# Patient Record
Sex: Female | Born: 1970 | Race: White | Hispanic: No | Marital: Married | Smoking: Never smoker
Health system: Southern US, Community
[De-identification: ages and names within clinical notes are randomized; demographics above are authoritative.]

## PROBLEM LIST (undated history)

## (undated) DIAGNOSIS — D649 Anemia, unspecified: Secondary | ICD-10-CM

## (undated) DIAGNOSIS — N949 Unspecified condition associated with female genital organs and menstrual cycle: Secondary | ICD-10-CM

## (undated) DIAGNOSIS — D509 Iron deficiency anemia, unspecified: Secondary | ICD-10-CM

## (undated) DIAGNOSIS — N83209 Unspecified ovarian cyst, unspecified side: Secondary | ICD-10-CM

## (undated) DIAGNOSIS — T7840XA Allergy, unspecified, initial encounter: Secondary | ICD-10-CM

## (undated) DIAGNOSIS — R3129 Other microscopic hematuria: Principal | ICD-10-CM

## (undated) HISTORY — DX: Other microscopic hematuria: R31.29

## (undated) HISTORY — DX: Allergy, unspecified, initial encounter: T78.40XA

## (undated) HISTORY — DX: Unspecified ovarian cyst, unspecified side: N83.209

## (undated) HISTORY — DX: Unspecified condition associated with female genital organs and menstrual cycle: N94.9

## (undated) HISTORY — DX: Anemia, unspecified: D64.9

## (undated) HISTORY — DX: Iron deficiency anemia, unspecified: D50.9

---

## 2004-07-13 ENCOUNTER — Ambulatory Visit (HOSPITAL_BASED_OUTPATIENT_CLINIC_OR_DEPARTMENT_OTHER): Admission: RE | Admit: 2004-07-13 | Discharge: 2004-07-13 | Payer: Self-pay | Admitting: Orthopaedic Surgery

## 2004-07-13 ENCOUNTER — Ambulatory Visit (HOSPITAL_COMMUNITY): Admission: RE | Admit: 2004-07-13 | Discharge: 2004-07-13 | Payer: Self-pay | Admitting: Orthopaedic Surgery

## 2005-09-14 ENCOUNTER — Ambulatory Visit: Payer: Self-pay | Admitting: Internal Medicine

## 2006-12-13 ENCOUNTER — Ambulatory Visit: Payer: Self-pay | Admitting: Internal Medicine

## 2006-12-14 ENCOUNTER — Ambulatory Visit: Payer: Self-pay | Admitting: Urology

## 2008-08-10 ENCOUNTER — Ambulatory Visit: Payer: Self-pay | Admitting: Internal Medicine

## 2008-09-02 ENCOUNTER — Ambulatory Visit: Payer: Self-pay | Admitting: Internal Medicine

## 2008-09-09 ENCOUNTER — Ambulatory Visit: Payer: Self-pay | Admitting: Internal Medicine

## 2008-10-10 ENCOUNTER — Ambulatory Visit: Payer: Self-pay | Admitting: Internal Medicine

## 2008-11-10 ENCOUNTER — Ambulatory Visit: Payer: Self-pay | Admitting: Internal Medicine

## 2009-01-06 ENCOUNTER — Ambulatory Visit: Payer: Self-pay | Admitting: Internal Medicine

## 2009-01-08 ENCOUNTER — Ambulatory Visit: Payer: Self-pay | Admitting: Internal Medicine

## 2009-04-01 ENCOUNTER — Ambulatory Visit: Payer: Self-pay | Admitting: Internal Medicine

## 2009-04-09 ENCOUNTER — Ambulatory Visit: Payer: Self-pay | Admitting: Internal Medicine

## 2009-07-10 ENCOUNTER — Ambulatory Visit: Payer: Self-pay | Admitting: Internal Medicine

## 2009-09-09 ENCOUNTER — Ambulatory Visit: Payer: Self-pay | Admitting: Internal Medicine

## 2009-09-16 ENCOUNTER — Ambulatory Visit: Payer: Self-pay | Admitting: Internal Medicine

## 2009-10-10 ENCOUNTER — Ambulatory Visit: Payer: Self-pay | Admitting: Internal Medicine

## 2009-10-10 HISTORY — PX: ABLATION: SHX5711

## 2009-11-10 ENCOUNTER — Ambulatory Visit: Payer: Self-pay | Admitting: Internal Medicine

## 2009-12-08 ENCOUNTER — Ambulatory Visit: Payer: Self-pay | Admitting: Internal Medicine

## 2010-01-08 ENCOUNTER — Ambulatory Visit: Payer: Self-pay | Admitting: Internal Medicine

## 2010-04-14 ENCOUNTER — Ambulatory Visit: Payer: Self-pay

## 2010-04-30 ENCOUNTER — Ambulatory Visit: Payer: Self-pay | Admitting: Internal Medicine

## 2010-05-10 ENCOUNTER — Ambulatory Visit: Payer: Self-pay | Admitting: Internal Medicine

## 2010-06-10 ENCOUNTER — Ambulatory Visit: Payer: Self-pay | Admitting: Internal Medicine

## 2010-07-06 ENCOUNTER — Ambulatory Visit: Payer: Self-pay | Admitting: Obstetrics and Gynecology

## 2010-07-09 ENCOUNTER — Ambulatory Visit: Payer: Self-pay | Admitting: Obstetrics and Gynecology

## 2010-09-01 ENCOUNTER — Ambulatory Visit: Payer: Self-pay | Admitting: Internal Medicine

## 2010-09-09 ENCOUNTER — Ambulatory Visit: Payer: Self-pay | Admitting: Internal Medicine

## 2010-10-10 ENCOUNTER — Ambulatory Visit: Payer: Self-pay | Admitting: Internal Medicine

## 2010-10-10 ENCOUNTER — Ambulatory Visit: Payer: Self-pay

## 2010-12-29 ENCOUNTER — Ambulatory Visit: Payer: Self-pay | Admitting: Internal Medicine

## 2011-01-09 ENCOUNTER — Ambulatory Visit: Payer: Self-pay | Admitting: Internal Medicine

## 2011-02-23 ENCOUNTER — Ambulatory Visit: Payer: Self-pay | Admitting: Internal Medicine

## 2011-03-11 ENCOUNTER — Ambulatory Visit: Payer: Self-pay | Admitting: Internal Medicine

## 2011-05-04 ENCOUNTER — Ambulatory Visit: Payer: Self-pay | Admitting: Internal Medicine

## 2011-05-11 ENCOUNTER — Ambulatory Visit: Payer: Self-pay

## 2011-05-11 ENCOUNTER — Ambulatory Visit: Payer: Self-pay | Admitting: Internal Medicine

## 2011-05-20 ENCOUNTER — Ambulatory Visit: Payer: Self-pay | Admitting: Internal Medicine

## 2011-06-15 ENCOUNTER — Ambulatory Visit: Payer: Self-pay | Admitting: Internal Medicine

## 2011-07-11 ENCOUNTER — Ambulatory Visit: Payer: Self-pay | Admitting: Internal Medicine

## 2011-08-11 ENCOUNTER — Ambulatory Visit: Payer: Self-pay | Admitting: Internal Medicine

## 2011-11-30 ENCOUNTER — Ambulatory Visit: Payer: Self-pay | Admitting: Internal Medicine

## 2011-11-30 LAB — IRON AND TIBC
Iron Saturation: 36 %
Iron: 91 ug/dL (ref 50–170)
Unbound Iron-Bind.Cap.: 160 ug/dL

## 2011-11-30 LAB — FERRITIN: Ferritin (ARMC): 273 ng/mL (ref 8–388)

## 2011-12-09 ENCOUNTER — Ambulatory Visit: Payer: Self-pay | Admitting: Internal Medicine

## 2012-04-25 ENCOUNTER — Ambulatory Visit: Payer: Self-pay

## 2012-10-01 ENCOUNTER — Ambulatory Visit: Payer: Self-pay | Admitting: Internal Medicine

## 2012-10-01 LAB — CBC CANCER CENTER
Basophil #: 0.1 x10 3/mm (ref 0.0–0.1)
Basophil %: 0.9 %
Eosinophil #: 0.2 x10 3/mm (ref 0.0–0.7)
Eosinophil %: 1.9 %
HGB: 14.4 g/dL (ref 12.0–16.0)
MCH: 30.2 pg (ref 26.0–34.0)
MCHC: 33.8 g/dL (ref 32.0–36.0)
MCV: 89 fL (ref 80–100)
Monocyte #: 0.5 x10 3/mm (ref 0.2–0.9)
Neutrophil %: 66.1 %
Platelet: 218 x10 3/mm (ref 150–440)
RBC: 4.77 10*6/uL (ref 3.80–5.20)
RDW: 12.9 % (ref 11.5–14.5)

## 2012-10-01 LAB — IRON AND TIBC
Iron Bind.Cap.(Total): 269 ug/dL (ref 250–450)
Unbound Iron-Bind.Cap.: 220 ug/dL

## 2012-10-01 LAB — FERRITIN: Ferritin (ARMC): 227 ng/mL (ref 8–388)

## 2012-10-10 ENCOUNTER — Ambulatory Visit: Payer: Self-pay | Admitting: Internal Medicine

## 2013-01-08 ENCOUNTER — Ambulatory Visit: Payer: Self-pay | Admitting: Internal Medicine

## 2013-05-08 ENCOUNTER — Ambulatory Visit: Payer: Self-pay

## 2013-05-10 ENCOUNTER — Ambulatory Visit: Payer: Self-pay | Admitting: Internal Medicine

## 2013-09-10 ENCOUNTER — Ambulatory Visit: Payer: Self-pay | Admitting: Internal Medicine

## 2013-11-28 ENCOUNTER — Ambulatory Visit: Payer: Self-pay | Admitting: Internal Medicine

## 2013-11-28 LAB — FERRITIN: Ferritin (ARMC): 309 ng/mL (ref 8–388)

## 2013-11-28 LAB — CBC CANCER CENTER
Basophil #: 0.1 x10 3/mm (ref 0.0–0.1)
Basophil %: 0.9 %
Eosinophil #: 0.2 x10 3/mm (ref 0.0–0.7)
Eosinophil %: 2.7 %
HCT: 43.6 % (ref 35.0–47.0)
HGB: 14.3 g/dL (ref 12.0–16.0)
LYMPHS ABS: 2.3 x10 3/mm (ref 1.0–3.6)
LYMPHS PCT: 24.9 %
MCH: 29.9 pg (ref 26.0–34.0)
MCHC: 32.8 g/dL (ref 32.0–36.0)
MCV: 91 fL (ref 80–100)
MONO ABS: 0.8 x10 3/mm (ref 0.2–0.9)
Monocyte %: 8.5 %
NEUTROS ABS: 5.9 x10 3/mm (ref 1.4–6.5)
Neutrophil %: 63 %
Platelet: 243 x10 3/mm (ref 150–440)
RBC: 4.77 10*6/uL (ref 3.80–5.20)
RDW: 12.8 % (ref 11.5–14.5)
WBC: 9.4 x10 3/mm (ref 3.6–11.0)

## 2013-11-28 LAB — IRON AND TIBC
IRON SATURATION: 17 %
Iron Bind.Cap.(Total): 271 ug/dL (ref 250–450)
Iron: 46 ug/dL — ABNORMAL LOW (ref 50–170)
Unbound Iron-Bind.Cap.: 225 ug/dL

## 2013-12-08 ENCOUNTER — Ambulatory Visit: Payer: Self-pay | Admitting: Internal Medicine

## 2013-12-26 DIAGNOSIS — N920 Excessive and frequent menstruation with regular cycle: Secondary | ICD-10-CM | POA: Insufficient documentation

## 2013-12-26 DIAGNOSIS — N83209 Unspecified ovarian cyst, unspecified side: Secondary | ICD-10-CM

## 2013-12-26 DIAGNOSIS — M799 Soft tissue disorder, unspecified: Secondary | ICD-10-CM | POA: Insufficient documentation

## 2013-12-26 DIAGNOSIS — N949 Unspecified condition associated with female genital organs and menstrual cycle: Secondary | ICD-10-CM

## 2013-12-26 DIAGNOSIS — D509 Iron deficiency anemia, unspecified: Secondary | ICD-10-CM | POA: Insufficient documentation

## 2013-12-26 DIAGNOSIS — D649 Anemia, unspecified: Secondary | ICD-10-CM

## 2013-12-26 DIAGNOSIS — M7989 Other specified soft tissue disorders: Secondary | ICD-10-CM | POA: Insufficient documentation

## 2013-12-26 HISTORY — DX: Iron deficiency anemia, unspecified: D50.9

## 2013-12-26 HISTORY — DX: Anemia, unspecified: D64.9

## 2013-12-26 HISTORY — DX: Unspecified condition associated with female genital organs and menstrual cycle: N94.9

## 2013-12-26 HISTORY — DX: Unspecified ovarian cyst, unspecified side: N83.209

## 2014-06-09 ENCOUNTER — Ambulatory Visit: Payer: Self-pay

## 2014-08-15 ENCOUNTER — Ambulatory Visit: Payer: Self-pay | Admitting: Internal Medicine

## 2014-08-15 LAB — CBC CANCER CENTER
Basophil #: 0.1 x10 3/mm (ref 0.0–0.1)
Basophil %: 0.8 %
Eosinophil #: 0.2 x10 3/mm (ref 0.0–0.7)
Eosinophil %: 1.4 %
HCT: 42.7 % (ref 35.0–47.0)
HGB: 14.4 g/dL (ref 12.0–16.0)
Lymphocyte #: 2.3 x10 3/mm (ref 1.0–3.6)
Lymphocyte %: 21.1 %
MCH: 31 pg (ref 26.0–34.0)
MCHC: 33.7 g/dL (ref 32.0–36.0)
MCV: 92 fL (ref 80–100)
Monocyte #: 0.8 x10 3/mm (ref 0.2–0.9)
Monocyte %: 6.8 %
NEUTROS ABS: 7.8 x10 3/mm — AB (ref 1.4–6.5)
NEUTROS PCT: 69.9 %
PLATELETS: 215 x10 3/mm (ref 150–440)
RBC: 4.64 10*6/uL (ref 3.80–5.20)
RDW: 12.6 % (ref 11.5–14.5)
WBC: 11.1 x10 3/mm — AB (ref 3.6–11.0)

## 2014-08-15 LAB — FERRITIN: FERRITIN (ARMC): 421 ng/mL — AB (ref 8–388)

## 2014-08-15 LAB — IRON AND TIBC
IRON BIND. CAP.(TOTAL): 246 ug/dL — AB (ref 250–450)
IRON SATURATION: 21 %
Iron: 52 ug/dL (ref 50–170)
UNBOUND IRON-BIND. CAP.: 194 ug/dL

## 2014-09-09 ENCOUNTER — Ambulatory Visit: Payer: Self-pay | Admitting: Internal Medicine

## 2015-05-04 ENCOUNTER — Other Ambulatory Visit: Payer: Self-pay | Admitting: Obstetrics and Gynecology

## 2015-05-04 DIAGNOSIS — Z1231 Encounter for screening mammogram for malignant neoplasm of breast: Secondary | ICD-10-CM

## 2015-05-07 ENCOUNTER — Other Ambulatory Visit: Payer: Self-pay | Admitting: Internal Medicine

## 2015-05-07 DIAGNOSIS — R1031 Right lower quadrant pain: Secondary | ICD-10-CM

## 2015-05-07 DIAGNOSIS — R319 Hematuria, unspecified: Secondary | ICD-10-CM

## 2015-05-07 DIAGNOSIS — R1032 Left lower quadrant pain: Secondary | ICD-10-CM

## 2015-05-08 ENCOUNTER — Ambulatory Visit
Admission: RE | Admit: 2015-05-08 | Discharge: 2015-05-08 | Disposition: A | Discharge status: Home / Self Care | Payer: BC Managed Care – PPO | Source: Ambulatory Visit | Attending: Internal Medicine | Admitting: Internal Medicine

## 2015-05-08 DIAGNOSIS — R319 Hematuria, unspecified: Secondary | ICD-10-CM | POA: Diagnosis not present

## 2015-05-08 DIAGNOSIS — R1032 Left lower quadrant pain: Secondary | ICD-10-CM | POA: Insufficient documentation

## 2015-05-08 DIAGNOSIS — R1031 Right lower quadrant pain: Secondary | ICD-10-CM | POA: Diagnosis present

## 2015-05-15 ENCOUNTER — Encounter: Payer: Self-pay | Admitting: Physical Therapy

## 2015-05-15 ENCOUNTER — Ambulatory Visit: Payer: BC Managed Care – PPO | Attending: Obstetrics and Gynecology | Admitting: Physical Therapy

## 2015-05-15 DIAGNOSIS — Z7409 Other reduced mobility: Secondary | ICD-10-CM | POA: Diagnosis present

## 2015-05-15 DIAGNOSIS — R279 Unspecified lack of coordination: Secondary | ICD-10-CM | POA: Insufficient documentation

## 2015-05-15 DIAGNOSIS — M25559 Pain in unspecified hip: Secondary | ICD-10-CM | POA: Diagnosis present

## 2015-05-16 NOTE — Therapy (Addendum)
[facility] MAIN [facility] SERVICES [address] [place], [place], [address] Phone: [phone]   Fax:  [phone]  Physical Therapy Evaluation  Patient Details  Name: Lauren Adkins MRN: [id number] Date of Birth: Jun 11, 1971 Referring Provider:  Christeen Douglas, MD  Encounter Date: 05/15/2015    Past Medical History  Diagnosis Date  . Allergy     Past Surgical History  Procedure Laterality Date  . Ablation  2011    abdominal for heavy periods     There were no vitals filed for this visit.  Visit Diagnosis:  Impaired functional mobility and activity tolerance  Lack of coordination  Pain in joint, pelvic region and thigh, unspecified laterality   Subjective: Pt has experienced pelvic pain for at least 44 yo with Sx that has been become increasingly worst w/ tolerating pelvic exams, years prior tampon wearing, dyspareunia, pain with sitting. Pain is constant (mild in the morning 2/10 , end of the day 8/10) since May . Easing factors: not sitting. Aggravating: sitting > 1.5 hr. 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  Pain w/ sexual intercourse more  intense  immediately afterwards  for 5 days, and pain also occurs with on top position. Pt reports frequent urination every 1.5 hr and SUI with sneezing, coughing, laughing.  Denied difficulty with initiating uriantion. 5x/week bowel movement but 505 of the time Type 4 and other 50% Type 6 (for the past 6 month). Pain has made it difficult to fall asleep due to position. (Sidelying is more comfortable) .  Pt went to Dr. Dareen Piano last week who performed some tests and she was told that there was blood in your urine, swollen lymph nodes, and MD suspects a viral abdominal infection.Pt had completed her antibiotics that was prescribed.           [facility] PT Assessment - 05/19/15 0753    Assessment   Medical Diagnosis pelvic pain    Precautions   Precautions None    Restrictions   Weight Bearing Restrictions No   Observation/Other Assessments   Other Surveys  --  FSS 78% (lower %=better function),FSFI 10% (low satisfaction   Coordination   Gross Motor Movements are Fluid and Coordinated --  mulitifidis delayed   Posture/Postural Control   Posture Comments chest breathing dominant  lumbopelvic instability w/ ALSR   AROM   Overall AROM Comments 25% bilateral rotation of trunk    Palpation   Spinal mobility increased paraspinal mm tensions,   SI assessment  decreased pain with medially applied force closure on iliac  L PSIS more anterior, tenderness w/ inferior glide   Palpation comment coccyx deviated to L , increased mm tensions on R coccygeus/ obt int                   [facility] Adult PT Treatment/Exercise - 05/19/15 0753    Self-Care   Self-Care --  standing in the back of room at meetings, movement of hips.   Neuro Re-ed    Neuro Re-ed Details  proper sitting posture, log rolling technique, diaphragmatic breathing   Exercises   Exercises --  figure -4 stretch seated, on R   Manual Therapy   Joint Mobilization sup glide of sacrum    Soft tissue mobilization at R coccygeus/ obt int R, decreased anterior pelvic pain post-Tx   decreased mm tensions, coccyx more medially aligned  PT Education - 05/19/15 0803    Education Details HEP, physiology and anatomy with images, POC, outcome measures, musculoskeletal causes for pelvic pain    Person(s) Educated Patient   Methods Explanation;Demonstration;Tactile cues;Verbal cues;Handout   Comprehension Verbalized understanding             PT Long Term Goals - 05/19/15 0804    PT LONG TERM GOAL #1   Title Pt will score an decrease from 78% to < 50%  in order to demo improved energy resesrve after work to increase participation w/ family at home.    Time 12   Period Weeks   Status New   PT LONG TERM GOAL #2   Title Pt will be able to score an increase from  10% to > 30% on FSFI in order to improve QOL.    Time 12   Period Weeks   Status New   PT LONG TERM GOAL #3   Title Pt will demo medially aligned coccyx and demo proper sitting posture w/o cuing across 2 visits in order to decrease pelvic girdle pain and increase ability to sit at her desk.   Time 12   Period Weeks   Status New               Plan - 05/19/15 0808    Clinical Impression Statement Pt is 44 yo female who c/o chronic pelvic pain.  Pt's S & Sx consist of L deviated coccyx, increased pelvic floor mm tensions, poor deep core coordination, poor sitting posture, and limited spinal mobility. These deficits impact her ability tolerate sitting through meetings at work and is associated with her inability  to tolerate pelvic exam, and sexual intercourse  without pain and to have energy after work to participate in fitnesss and family activities. Pt's pelvic pain decreased from 7/10 to 4/10 post-manual Tx and pt demo'd proper sitting posture after neuro-re-edcation.     Pt will benefit from skilled therapeutic intervention in order to improve on the following deficits Decreased activity tolerance;Decreased endurance;Decreased coordination;Decreased balance;Decreased range of motion;Decreased safety awareness;Impaired flexibility;Postural dysfunction;Improper body mechanics;Decreased strength;Increased muscle spasms;Hypomobility;Pain   Rehab Potential Good   Clinical Impairments Affecting Rehab Potential chronicity, sedentary job,    PT Frequency 1x / week   PT Duration 12 weeks   PT Treatment/Interventions ADLs/Self Care Home Management;Aquatic Therapy;Biofeedback;Cryotherapy;Electrical Stimulation;Moist Heat;Traction;Patient/family education;Neuromuscular re-education;Therapeutic exercise;Balance training;Therapeutic activities;Functional mobility training;Gait training;Manual techniques;Scar mobilization;Passive range of motion;Energy conservation;Dry needling   PT Next Visit Plan ergo  assessment, car seat modification   Consulted and Agree with Plan of Care Patient         Problem List There are no active problems to display for this patient.   Mariane Masters  ,PT, DPT, E-RYT  05/19/2015, 8:18 AM  [place] [facility] MAIN [facility] SERVICES [address] [place], [place], [address] Phone: [phone]   Fax:  [phone]

## 2015-05-19 ENCOUNTER — Ambulatory Visit: Payer: BC Managed Care – PPO | Admitting: Physical Therapy

## 2015-05-19 DIAGNOSIS — M25559 Pain in unspecified hip: Secondary | ICD-10-CM

## 2015-05-19 DIAGNOSIS — Z7409 Other reduced mobility: Secondary | ICD-10-CM | POA: Diagnosis not present

## 2015-05-19 DIAGNOSIS — R279 Unspecified lack of coordination: Secondary | ICD-10-CM

## 2015-05-19 NOTE — Patient Instructions (Addendum)
Proper sitting posture Diaphragmatic breathing Log rolling technique out of bed  Figure 4 stretch on RLE seated

## 2015-05-19 NOTE — Addendum Note (Signed)
Addended by: Mariane Masters on: 05/19/2015 08:23 AM   Modules accepted: Orders

## 2015-05-20 NOTE — Patient Instructions (Addendum)
Child's pose rocking, and pelvic tilt with breathing awareness when laying on back Child's pose stretch and modified in standing at work  Biomedical scientist modification w/ towels Equal contact of back of pelvis to car seat when driving

## 2015-05-20 NOTE — Therapy (Signed)
[place] [facility] MAIN [facility] SERVICES [address] [place], [place], [address] Phone: [phone]   Fax:  [phone]  Physical Therapy Treatment  Patient Details  Name: Lauren Adkins MRN: [id number] Date of Birth: 1970-12-14 Referring Provider:  Christeen Douglas, MD  Encounter Date: 05/19/2015      PT End of Session - 05/20/15 0908    Visit Number 2   Number of Visits 12   Date for PT Re-Evaluation 07/22/15   PT Start Time 0905   PT Stop Time 1010   PT Time Calculation (min) 65 min   Activity Tolerance Patient tolerated treatment well;No increased pain  reported decreased pain from 7/10 to 3/10 post Tx   Behavior During Therapy [facility] for tasks assessed/performed      Past Medical History  Diagnosis Date  . Allergy     Past Surgical History  Procedure Laterality Date  . Ablation  2011    abdominal for heavy periods     There were no vitals filed for this visit.  Visit Diagnosis:  Impaired functional mobility and activity tolerance  Lack of coordination  Pain in joint, pelvic region and thigh, unspecified laterality      Subjective Assessment - 05/19/15 0910    Subjective Pt reported she was feeling "pretty good" after last session. Overall, she "feels her pain is better"  despite a strong epiosde on Sat which she did not noticed how long it lasted. Today pt feels the best she has felt in weeks!  Pt still felt fatigued by the end of her work day yesterday but she tried to stand during meetings, stretched, and walked.  Pt has completed her antibiotics.    Pertinent History "Broke tailbone" during childbirth 44 yo  (pain getting out of bed, sitting down) and resolved in 44 yo. No tears/episiotomy.     How long can you sit comfortably? 1.5 hrs   Patient Stated Goals 1) get back to "normalcy" 2) increased energy (from 7/10 fatigue level to 3-4/10)  to cook , clean, physical activity              [facility] PT Assessment -  05/20/15 0859    Assessment   Medical Diagnosis pelvic pain    Precautions   Precautions None   Restrictions   Weight Bearing Restrictions No   Observation/Other Assessments   Other Surveys  --  FSS 78% (lower %=better function),FSFI 10% (low satisfaction   Coordination   Gross Motor Movements are Fluid and Coordinated --  mulitifidis delayed   Posture/Postural Control   Posture Comments improved diaphragmatic breathing post0-Tx   Palpation   Spinal mobility decreased paraspinal mm tensions post-Tx,   Palpation comment coccyx medially alignmed  decreased R coccygeus/sacrospinous ligament tensions post-Tx                     [facility] Adult PT Treatment/Exercise - 05/20/15 0859    Bed Mobility   Bed Mobility --  required review of log rolling   Self-Care   Self-Care --  demo'd carseat modifications w./ towels   Neuro Re-ed    Neuro Re-ed Details  log rolling, childs pose stretch on bed and standing, childs pose rocking, pelvic tilt in hooklying, applications of pelvic propioception in sexual positions, ensure equal contact of PSIS against car seat when driving  cat/cow was difficult for pt, withheld   Moist Heat Therapy   Number Minutes Moist Heat 8 Minutes   Moist Heat Location  Lumbar Spine  skin intact post-Tx   Manual Therapy   Joint Mobilization Grade III PA mobs   T5, T7, 10   spinous processes   Soft tissue mobilization sustained pressure, myofascial release R occcygeus/sacrospinous ligament area  paraspinals effleurage                PT Education - 05/20/15 0908    Education provided Yes   Education Details HEP   Person(s) Educated Patient   Methods Explanation;Demonstration;Tactile cues;Verbal cues;Handout   Comprehension Verbalized understanding;Returned demonstration             PT Long Term Goals - 05/19/15 0804    PT LONG TERM GOAL #1   Title Pt will score an decrease from 78% to < 50%  in order to demo improved energy resesrve  after work to increase participation w/ family at home.    Time 12   Period Weeks   Status New   PT LONG TERM GOAL #2   Title Pt will be able to score an increase from 10% to > 30% on FSFI in order to improve QOL.    Time 12   Period Weeks   Status New   PT LONG TERM GOAL #3   Title Pt will demo medially aligned coccyx and demo proper sitting posture w/o cuing across 2 visits in order to decrease pelvic girdle pain and increase ability to sit at her desk.   Time 12   Period Weeks   Status New               Plan - 05/20/15 0909    Clinical Impression Statement Pt is progressing well with lasting effects of manual Tx as pt reported feeling the best she has felt in a weeks today despite one episode of pain over the weekend. Post-Tx today,  pt showed more medially aligned coccyx today and decreased tensions at  R coccygeus/sacrospinous ligament area. Pt showed difficulty with cat/cow exercise and was downgraded to childs pose rocking. Pt will continue to benefit from manual Tx/ ther ex/ neuro-reedu to maintain spinal mobility and learn lumbopelvic propioception and motor control. Anticipate pt will continue to progress well. .     Pt will benefit from skilled therapeutic intervention in order to improve on the following deficits Decreased activity tolerance;Decreased endurance;Decreased coordination;Decreased balance;Decreased range of motion;Decreased safety awareness;Impaired flexibility;Postural dysfunction;Improper body mechanics;Decreased strength;Increased muscle spasms;Hypomobility;Pain   Rehab Potential Good   Clinical Impairments Affecting Rehab Potential chronicity, sedentary job,    PT Frequency 1x / week   PT Duration 12 weeks   PT Treatment/Interventions ADLs/Self Care Home Management;Aquatic Therapy;Biofeedback;Cryotherapy;Electrical Stimulation;Moist Heat;Traction;Patient/family education;Neuromuscular re-education;Therapeutic exercise;Balance training;Therapeutic  activities;Functional mobility training;Gait training;Manual techniques;Scar mobilization;Passive range of motion;Energy conservation;Dry needling   PT Next Visit Plan ergo assessment, intravaginal assessment   Consulted and Agree with Plan of Care Patient        Problem List There are no active problems to display for this patient.   Mariane Masters  ,PT, DPT, E-RYT  05/20/2015, 9:17 AM  [place] [facility] MAIN [facility] SERVICES [address] [place], [place], [address] Phone: [phone]   Fax:  [phone]

## 2015-06-01 ENCOUNTER — Telehealth: Payer: Self-pay | Admitting: Urology

## 2015-06-01 ENCOUNTER — Encounter: Payer: Self-pay | Admitting: *Deleted

## 2015-06-01 ENCOUNTER — Ambulatory Visit (INDEPENDENT_AMBULATORY_CARE_PROVIDER_SITE_OTHER): Payer: BC Managed Care – PPO | Admitting: Urology

## 2015-06-01 VITALS — BP 128/85 | HR 81 | Resp 18 | Ht 70.0 in | Wt 194.4 lb

## 2015-06-01 DIAGNOSIS — R3129 Other microscopic hematuria: Secondary | ICD-10-CM

## 2015-06-01 DIAGNOSIS — R312 Other microscopic hematuria: Secondary | ICD-10-CM

## 2015-06-01 DIAGNOSIS — R102 Pelvic and perineal pain unspecified side: Secondary | ICD-10-CM

## 2015-06-01 HISTORY — DX: Other microscopic hematuria: R31.29

## 2015-06-01 LAB — MICROSCOPIC EXAMINATION: Bacteria, UA: NONE SEEN

## 2015-06-01 LAB — URINALYSIS, COMPLETE
BILIRUBIN UA: NEGATIVE
GLUCOSE, UA: NEGATIVE
KETONES UA: NEGATIVE
Nitrite, UA: NEGATIVE
PROTEIN UA: NEGATIVE
Specific Gravity, UA: 1.025 (ref 1.005–1.030)
Urobilinogen, Ur: 0.2 mg/dL (ref 0.2–1.0)
pH, UA: 5.5 (ref 5.0–7.5)

## 2015-06-01 LAB — BLADDER SCAN AMB NON-IMAGING

## 2015-06-01 NOTE — Telephone Encounter (Signed)
Pt seen by Dr. Mena Goes 8/22 and was to schedule a cysto in 2-3 weeks.  Patient elected to go back to office, check her schedule and will call back to make appt.  Follow up

## 2015-06-01 NOTE — Progress Notes (Signed)
Patient seen today for several issues refer by PCP Dr. Dareen Piano. GYN Dr. Dalbert Garnet.  1-microscopic hematuria-urinalyses over July 20 15th of July 2016 had shown anywhere from 4-10 red blood cells per high-powered field up to 10-50 red blood cells per high-powered field. Patient underwent a recent CT scan without IV contrast which showed a benign urinary tract. It did show enteritis with some mesenteric stranding and some nonspecific lymphadenopathy in the abdomen. She also had a 2.8 cm left adnexal cyst. Her BUN was 6 and creatinine 0.7.  No gross hematuria. No exposure risk.  She has a h/o of what sounds like nephrocalcinosis and was seen by Dr. Achilles Dunk, but no stones on recent CT.   2- Patient has some baseline urinary frequency and urgency - she was treated about 16 yrs ago with medication.   2-pelvic pain-patient was seen recently by her GYN Dr. Dalbert Garnet and complained of pain in her pelvis and dyspareunia. Patient had a normal pelvic exam with a nontender bladder and adnexa but was tender along the pelvic floor including the piriformis and the urethra. She was appropriately referred to physical therapy which is pending.  Review of systems was obtained, 13 system, positive for nausea, diarrhea, fatigue, swollen glands, back pain, headache and dizziness. Otherwise negative.  Imaging and lab work as above.   Assessment/plan:  #1 microscopic hematuria-patient does need complete evaluation with repeat pelvic exam and cystoscopy. This will be arranged. Her UA is clear today.  #2 urinary frequency, urgency-discussed the nature risks and benefits of trial of an anti-muscarinic. All questions answered. She elects to continue surveillance. See below. Doing better with PT.   #3 pelvic pain-I did recommend she keep her appointments. She has been to two PT sessions and doing well. She notes an improvement in her symptoms. We discussed physical therapy can improve pelvic pain and also lower urinary tract  symptoms.

## 2015-06-02 ENCOUNTER — Ambulatory Visit: Payer: BC Managed Care – PPO | Admitting: Physical Therapy

## 2015-06-02 DIAGNOSIS — R279 Unspecified lack of coordination: Secondary | ICD-10-CM

## 2015-06-02 DIAGNOSIS — Z7409 Other reduced mobility: Secondary | ICD-10-CM | POA: Diagnosis not present

## 2015-06-02 DIAGNOSIS — M25559 Pain in unspecified hip: Secondary | ICD-10-CM

## 2015-06-03 NOTE — Therapy (Signed)
[place] [facility] MAIN [facility] SERVICES [address] [place], [place], [address] Phone: [phone]   Fax:  [phone]  Physical Therapy Treatment  Patient Details  Name: Lauren Adkins MRN: [id number] Date of Birth: [birth year] Referring Provider:  Christeen Douglas, MD  Encounter Date: 06/02/2015      PT End of Session - 06/03/15 0823    Visit Number 3   Number of Visits 12   Date for PT Re-Evaluation 07/22/15   PT Start Time 0805   PT Stop Time 0905   PT Time Calculation (min) 60 min   Activity Tolerance Patient tolerated treatment well;No increased pain  reported decreased pain from 7/10 to 3/10 post Tx   Behavior During Therapy [facility] for tasks assessed/performed      Past Medical History  Diagnosis Date  . Allergy     Past Surgical History  Procedure Laterality Date  . Ablation  2011    abdominal for heavy periods     There were no vitals filed for this visit.  Visit Diagnosis:  Impaired functional mobility and activity tolerance  Lack of coordination  Pain in joint, pelvic region and thigh, unspecified laterality      Subjective Assessment - 06/02/15 0807    Subjective Pt states her  pelvic pain has been 2/10 for past weeks but she struggles to relax.  Pt is been trying to get back to her HEP  this week. Through motivational interveiwing, pt stated she has a strategy to stay compliant by placing her exercises sheet on the diner room table.    Pertinent History "Broke tailbone" during childbirth 44 yo  (pain getting out of bed, sitting down) and resolved in 44 yo. No tears/episiotomy.     How long can you sit comfortably? 1.5 hrs   Patient Stated Goals 1) get back to "normalcy" 2) increased energy (from 7/10 fatigue level to 3-4/10)  to cook , clean, physical activity     Currently in Pain? Yes   Pain Score 2             [facility] PT Assessment - 06/03/15 0818    Observation/Other Assessments   Other Surveys  Other  Surveys  FSS 78% ,FSFI 10%, Zung Anxiety 50%    Palpation   Spinal mobility decreased paraspinal mm tensions post-Tx,   Palpation comment coccyx slight deviation to L   medially aligned post-Tx                  Pelvic Floor Special Questions - 06/03/15 0819    Pelvic Floor Internal Exam pt verbally consented without contraindications   Exam Type Vaginal   Palpation increased tenderness along iliococcygeus/ Obt Int R   decreased tenderness.tensions post-Tx           [facility] Adult PT Treatment/Exercise - 06/03/15 0820    Manual Therapy   Soft tissue mobilization at R coccygeus/ obt int R, decreased anterior pelvic pain post-Tx   decreased mm tensions, coccyx more medially aligned   Internal Pelvic Floor sustained pressure and thiele massage along R obt int/iliococcygeus                PT Education - 06/03/15 6962    Education provided Yes   Education Details HEP   Person(s) Educated Patient   Methods Explanation;Demonstration;Tactile cues;Verbal cues   Comprehension Verbalized understanding;Returned demonstration             PT Long Term Goals - 06/03/15 9528  PT LONG TERM GOAL #1   Title Pt will score an decrease from 78% to < 50%  in order to demo improved energy resesrve after work to increase participation w/ family at home.    Time 12   Period Weeks   Status On-going   PT LONG TERM GOAL #2   Title Pt will be able to score an increase from 10% to > 30% on FSFI in order to improve QOL.    Time 12   Period Weeks   Status On-going   PT LONG TERM GOAL #3   Title Pt will demo medially aligned coccyx and demo proper sitting posture w/o cuing across 2 visits in order to decrease pelvic girdle pain and increase ability to sit at her desk.   Time 12   Period Weeks   Status Achieved               Plan - 06/03/15 1610    Clinical Impression Statement Pt cotninues to significantly low pelvic pain levels (2/10) the past two weeks. Pt  tolerated internal manual Tx with decreased tenderness / tensions along R obturator internus and iliococcygeus as well as decreased mm tensions R coccygeus/sacrospinous ligament area through external manual Tx.  Pt showed medially aligned coccyx and improved upright posture. Initiated body scan practice to address anxiety and pt's need to learn to relax. Pt will continue to benefit from skilled PT to advance towards her goals.      Pt will benefit from skilled therapeutic intervention in order to improve on the following deficits Decreased activity tolerance;Decreased endurance;Decreased coordination;Decreased balance;Decreased range of motion;Decreased safety awareness;Impaired flexibility;Postural dysfunction;Improper body mechanics;Decreased strength;Increased muscle spasms;Hypomobility;Pain   Rehab Potential Good   Clinical Impairments Affecting Rehab Potential chronicity, sedentary job,    PT Frequency 1x / week   PT Duration 12 weeks   PT Treatment/Interventions ADLs/Self Care Home Management;Aquatic Therapy;Biofeedback;Cryotherapy;Electrical Stimulation;Moist Heat;Traction;Patient/family education;Neuromuscular re-education;Therapeutic exercise;Balance training;Therapeutic activities;Functional mobility training;Gait training;Manual techniques;Scar mobilization;Passive range of motion;Energy conservation;Dry needling   PT Next Visit Plan ergo assessment, deep core ex, restorative poses   Consulted and Agree with Plan of Care Patient        Problem List Patient Active Problem List   Diagnosis Date Noted  . Microhematuria 06/01/2015  . Pelvic pain in female 06/01/2015  . Absolute anemia 12/26/2013  . Soft tissue lesion of shoulder region 12/26/2013  . Excessive and frequent menstruation 12/26/2013  . Anemia, iron deficiency 12/26/2013  . Cyst of ovary 12/26/2013  . Female genital symptoms 12/26/2013    Mariane Masters ,PT, DPT, E-RYT  06/03/2015, 8:29 AM  [place] [facility] MAIN [facility] SERVICES [address] [place], [place], [address] Phone: [phone]   Fax:  [phone]

## 2015-06-03 NOTE — Patient Instructions (Signed)
Frog stretch  Body scan practice nightly (audio file emailed to pt w/ research-based effectiveness) to help with relaxation Return to HEP

## 2015-06-09 ENCOUNTER — Ambulatory Visit: Payer: BC Managed Care – PPO | Admitting: Physical Therapy

## 2015-06-09 DIAGNOSIS — M25559 Pain in unspecified hip: Secondary | ICD-10-CM

## 2015-06-09 DIAGNOSIS — Z7409 Other reduced mobility: Secondary | ICD-10-CM | POA: Diagnosis not present

## 2015-06-09 DIAGNOSIS — R279 Unspecified lack of coordination: Secondary | ICD-10-CM

## 2015-06-10 NOTE — Patient Instructions (Addendum)
Pelvic tilt anterior/ posterior in hooklying, sitting, and in squat /back against wall    Continue stretches  Increase water intake by having a water bottle. (16 fl oz x 2 / day)

## 2015-06-11 ENCOUNTER — Ambulatory Visit: Payer: Self-pay

## 2015-06-11 NOTE — Therapy (Addendum)
[place] MAIN [facility] SERVICES [address] [place], [place], [address] Phone: [phone]   Fax:  [phone]  Physical Therapy Treatment  Patient Details  Name: Lauren Adkins MRN: [id number] Date of Birth: May 13, 1971 Referring Provider:  Benjaman Kindler, MD  Encounter Date: 06/09/2015      PT End of Session - 06/10/15 2347    Visit Number 4   Number of Visits 12   Date for PT Re-Evaluation 07/22/15   PT Start Time 1700   PT Stop Time 2992   PT Time Calculation (min) 70 min   Activity Tolerance Patient tolerated treatment well;No increased pain  reported decreased pain from 7/10 to 3/10 post Tx   Behavior During Therapy [facility] for tasks assessed/performed      Past Medical History  Diagnosis Date  . Allergy     Past Surgical History  Procedure Laterality Date  . Ablation  2011    abdominal for heavy periods     There were no vitals filed for this visit.  Visit Diagnosis:  Impaired functional mobility and activity tolerance  Lack of coordination  Pain in joint, pelvic region and thigh, unspecified laterality      Subjective Assessment - 06/11/15 0807    Subjective Pt has had no pain for 1 week since last session. Pt has not had pain with intercourse. Pt reports she continues to have Type 6 Stool Type 50% of her bowel movements.    Pertinent History "Broke tailbone" during childbirth 44 yo  (pain getting out of bed, sitting down) and resolved in 44 yo. No tears/episiotomy.     How long can you sit comfortably? 1.5 hrs   Patient Stated Goals 1) get back to "normalcy" 2) increased energy (from 7/10 fatigue level to 3-4/10)  to cook , clean, physical activity              [facility] PT Assessment - 06/11/15 0005    Observation/Other Assessments   Other Surveys  --  FSS decreased 78% to 54%, FSFI from 10%  to 81.2%    Palpation   SI assessment  decreased mobility of lateral aspect of sacrum, report of relief w/  inferior glide    Palpation comment coccyx medially aligned  increased tenderness along lateral coccygeus near coccyx                      [facility] Adult PT Treatment/Exercise - 06/11/15 0005    Neuro Re-ed    Neuro Re-ed Details  circular motion,ant/pelvic pelvic tilt, hooklying w/ breathing, and against wall (wall squat) , and sitting   initially w/ difficulty, circular motion w/ less difficulty    Manual Therapy   Joint Mobilization inferior glides of sacrum, Grade II PA mobs along lateral distal aspect of coccyx    increased mobility sacrum    Soft tissue mobilization L coccgyeus near coccyx and below S4  stripping    Internal Pelvic Floor sustained pressure and thiele massage along R iliococcygeus   decreased tensions/ tenderness                 PT Education - 06/10/15 2347    Education provided Yes   Education Details HEP   Person(s) Educated Patient   Methods Explanation;Demonstration;Tactile cues;Verbal cues;Handout   Comprehension Verbalized understanding;Returned demonstration             PT Long Term Goals - 06/11/15 0011    PT LONG TERM GOAL #  1   Title Pt will score on FSS an decrease from 78% to < 50%  in order to demo improved energy resesrve after work to increase participation w/ family at home.  (06/09/15: 54% )    Time 12   Period Weeks   Status Partially Met   PT LONG TERM GOAL #2   Title Pt will be able to score an increase from 10% to > 30% on FSFI in order to improve QOL.    Time 12   Period Weeks   Status Achieved   PT LONG TERM GOAL #3   Title Pt will demo medially aligned coccyx and demo proper sitting posture w/o cuing across 2 visits in order to decrease pelvic girdle pain and increase ability to sit at her desk.   Time 12   Period Weeks   Status Achieved   PT LONG TERM GOAL #4   Title Pt will demo no pelvic instability with depp core strengthening (dynamic stabilization level 1-4) in order to progress to upright  strengthening.    Time 12   Period Weeks   Status New   PT LONG TERM GOAL #5   Title Pt will demo no mm tensions/ tenderness R iliococcygeus and lateral aspect of sacrum/coccyx for 2 visits in order to minimize risk for relapse.    Time 12   Period Weeks   Status New               Plan - 06/10/15 2348    Clinical Impression Statement Pt is progressing well increased ability to sit for longer periods of time, has increased energy after work (Fatigue Severity Scale decreased 78% to 54%), and increased ability to tolerate sexual intercourse without pain showing increased FSFI score from 10% to 81.2% . Pt showed  significantly decreased pelvic floor tensions overall with minimal mm tensions/ tenderness on R iliococcygeus > L . Although pt's coccyx was more medially aligned,   tenderness/ tensions/decreased mobility near L lateral aspect of coccyx/ below S4 was still evident.  Pt responded well to manual Tx to increase mobility of sacrum/coccyx but required increased neuro-reedu training for pelvic proprioception. Pt will continue to require skilled PT to address remaining goals.      Pt will benefit from skilled therapeutic intervention in order to improve on the following deficits Decreased activity tolerance;Decreased endurance;Decreased coordination;Decreased balance;Decreased range of motion;Decreased safety awareness;Impaired flexibility;Postural dysfunction;Improper body mechanics;Decreased strength;Increased muscle spasms;Hypomobility;Pain   Rehab Potential Good   Clinical Impairments Affecting Rehab Potential chronicity, sedentary job,    PT Frequency 1x / week   PT Duration 12 weeks   PT Treatment/Interventions ADLs/Self Care Home Management;Aquatic Therapy;Biofeedback;Cryotherapy;Electrical Stimulation;Moist Heat;Traction;Patient/family education;Neuromuscular re-education;Therapeutic exercise;Balance training;Therapeutic activities;Functional mobility training;Gait training;Manual  techniques;Scar mobilization;Passive range of motion;Energy conservation;Dry needling   PT Next Visit Plan ergo assessment, deep core ex, restorative poses   Consulted and Agree with Plan of Care Patient        Problem List Patient Active Problem List   Diagnosis Date Noted  . Microhematuria 06/01/2015  . Pelvic pain in female 06/01/2015  . Absolute anemia 12/26/2013  . Soft tissue lesion of shoulder region 12/26/2013  . Excessive and frequent menstruation 12/26/2013  . Anemia, iron deficiency 12/26/2013  . Cyst of ovary 12/26/2013  . Female genital symptoms 12/26/2013    Mariane Masters ,PT, DPT, E-RYT  06/11/2015, 8:07 AM  [place] [facility] MAIN [facility] SERVICES [address] [place], [place], [address] Phone: [phone]   Fax:  [phone]      

## 2015-06-18 ENCOUNTER — Ambulatory Visit
Admission: RE | Admit: 2015-06-18 | Discharge: 2015-06-18 | Disposition: A | Discharge status: Home / Self Care | Payer: BC Managed Care – PPO | Source: Ambulatory Visit | Attending: Obstetrics and Gynecology | Admitting: Obstetrics and Gynecology

## 2015-06-18 ENCOUNTER — Ambulatory Visit: Payer: BC Managed Care – PPO | Attending: Obstetrics and Gynecology | Admitting: Physical Therapy

## 2015-06-18 DIAGNOSIS — Z7409 Other reduced mobility: Secondary | ICD-10-CM | POA: Insufficient documentation

## 2015-06-18 DIAGNOSIS — M25559 Pain in unspecified hip: Secondary | ICD-10-CM | POA: Diagnosis present

## 2015-06-18 DIAGNOSIS — R279 Unspecified lack of coordination: Secondary | ICD-10-CM

## 2015-06-18 DIAGNOSIS — Z1231 Encounter for screening mammogram for malignant neoplasm of breast: Secondary | ICD-10-CM | POA: Insufficient documentation

## 2015-06-19 NOTE — Patient Instructions (Signed)
Restorative yoga poses (2)  Handout provided  Strategies to carve out time for self care between work and family hours  Maintain stretches at work   Resume PT in 2 weeks to allow for home practice and prepare for creating time in schedule to impliment strengthening exercises

## 2015-06-19 NOTE — Therapy (Signed)
[place] MAIN [facility] SERVICES [address] [place], [place], [address] Phone: [phone]   Fax:  [phone]  Physical Therapy Treatment  Patient Details  Name: Lauren Adkins MRN: [id number] Date of Birth: 12-28-1970 Referring Provider:  Benjaman Kindler, MD  Encounter Date: 06/18/2015      PT End of Session - 06/19/15 1257    Visit Number 5   Number of Visits 12   Date for PT Re-Evaluation 07/22/15   PT Start Time 1700   PT Stop Time 1800   PT Time Calculation (min) 60 min   Activity Tolerance Patient tolerated treatment well;No increased pain  reported decreased pain from 7/10 to 3/10 post Tx   Behavior During Therapy [facility] for tasks assessed/performed      Past Medical History  Diagnosis Date  . Allergy     Past Surgical History  Procedure Laterality Date  . Ablation  2011    abdominal for heavy periods     There were no vitals filed for this visit.  Visit Diagnosis:  Impaired functional mobility and activity tolerance  Lack of coordination  Pain in joint, pelvic region and thigh, unspecified laterality      Subjective Assessment - 06/18/15 1701    Subjective Pt was able to sit in her meetings at work without pain and has been stetching throughout the day. Bowel movements have been loose the past 5 days but has returned to normal.  Fatigue after work continues to be an issue as she feels exhausted and prefers to go to sleep instead of performing the body scan. Pt feels guilty for not performing her HEP. Pt is concerned about how to mainitain the benefits of PT to prevent relapse of Sx.     Pertinent History "Broke tailbone" during childbirth 44 yo  (pain getting out of bed, sitting down) and resolved in 44 yo. No tears/episiotomy.     How long can you sit comfortably? 1.5 hrs   Patient Stated Goals 1) get back to "normalcy" 2) increased energy (from 7/10 fatigue level to 3-4/10)  to cook , clean, physical activity               [facility] PT Assessment - 06/19/15 1249    Palpation   SI assessment  normal mobility of lateral aspect of sacrum   Palpation comment coccyx medially aligned without increased tensions/ tenderness along L                   Pelvic Floor Special Questions - 06/19/15 1252    Pelvic Floor Internal Exam pt verbally consented without contraindications   Exam Type Vaginal   Palpation minimal tenderness along sacrospinous ligament bilaterally only           [facility] Adult PT Treatment/Exercise - 06/19/15 1252    Posture/Postural Control   Posture Comments improved diaphragmatic breathing with pelvic floor ROM    Self-Care   Other Self-Care Comments  motional interviewing to strategize how to overcome barriers to compliance to HEP, explanation of POC and progression into strengthening, techniques to create more time for self-care with transitions between work and family life   restorative poses (2), body scan                 PT Education - 06/19/15 1256    Education provided Yes   Education Details HEP, see "self care" under Tx   Person(s) Educated Patient   Methods Explanation;Demonstration;Tactile cues;Verbal cues;Handout  Comprehension Returned demonstration;Verbalized understanding             PT Long Term Goals - 06/18/15 1710    PT LONG TERM GOAL #1   Title Pt will score on FSS an decrease from 78% to < 50%  in order to demo improved energy resesrve after work to increase participation w/ family at home.  (06/09/15: 54% )    Time 12   Period Weeks   Status Partially Met   PT LONG TERM GOAL #2   Title Pt will be able to score an increase from 10% to > 30% on FSFI in order to improve QOL.    Time 12   Period Weeks   Status Achieved   PT LONG TERM GOAL #3   Title Pt will demo medially aligned coccyx and demo proper sitting posture w/o cuing across 2 visits in order to decrease pelvic girdle pain and increase ability to sit at her desk.    Time 12   Period Weeks   Status Achieved   PT LONG TERM GOAL #4   Title Pt will demo no pelvic instability with deep core strengthening (dynamic stabilization level 1-4) in order to progress to upright strengthening.    Time 12   Period Weeks   Status New   PT LONG TERM GOAL #5   Title Pt will demo no mm tensions/ tenderness R iliococcygeus and lateral aspect of sacrum/coccyx for 2 visits in order to minimize risk for relapse.    Time 12   Period Weeks   Status Achieved               Plan - 06/19/15 1257    Clinical Impression Statement Pt has achieved 3/5 goals with resolution of pelvic pain when sitting and sexual intercourse. Pt has demo'd more medially aligned coccyx, decreased pelvic floor mm and back tensions. PT provided education on the importance of strengthening phase of rehab to minimize relapse of Sx and improve fatigue. PT applied motivational interviewing to promote compliance to HEP. Pt expressed interest in pursuing strengthening stage of rehab  and PT recommended resuming PT session in 2 weeks in order for pt to impliment a  self-care routine to yield better outcomes when pt returns to learn new HEP program.  Pt agreed and voiced motivation.     Pt will benefit from skilled therapeutic intervention in order to improve on the following deficits Decreased activity tolerance;Decreased endurance;Decreased coordination;Decreased balance;Decreased range of motion;Decreased safety awareness;Impaired flexibility;Postural dysfunction;Improper body mechanics;Decreased strength;Increased muscle spasms;Hypomobility;Pain   Rehab Potential Good   Clinical Impairments Affecting Rehab Potential chronicity, sedentary job,    PT Frequency 1x / week   PT Duration 12 weeks   PT Treatment/Interventions ADLs/Self Care Home Management;Aquatic Therapy;Biofeedback;Cryotherapy;Electrical Stimulation;Moist Heat;Traction;Patient/family education;Neuromuscular re-education;Therapeutic  exercise;Balance training;Therapeutic activities;Functional mobility training;Gait training;Manual techniques;Scar mobilization;Passive range of motion;Energy conservation;Dry needling   PT Next Visit Plan pt returning in two weeks and will begin deep core strengthening ex    Consulted and Agree with Plan of Care Patient        Problem List Patient Active Problem List   Diagnosis Date Noted  . Microhematuria 06/01/2015  . Pelvic pain in female 06/01/2015  . Absolute anemia 12/26/2013  . Soft tissue lesion of shoulder region 12/26/2013  . Excessive and frequent menstruation 12/26/2013  . Anemia, iron deficiency 12/26/2013  . Cyst of ovary 12/26/2013  . Female genital symptoms 12/26/2013    Jerl Mina  .syopt  06/19/2015, 1:06 PM  [place] MAIN [facility] SERVICES [address] [place], [place], [address] Phone: [phone]   Fax:  [phone]

## 2015-06-23 ENCOUNTER — Encounter: Payer: BC Managed Care – PPO | Admitting: Physical Therapy

## 2015-06-30 ENCOUNTER — Encounter: Payer: BC Managed Care – PPO | Admitting: Physical Therapy

## 2015-07-07 ENCOUNTER — Ambulatory Visit: Payer: BC Managed Care – PPO | Admitting: Physical Therapy

## 2015-07-07 DIAGNOSIS — R279 Unspecified lack of coordination: Secondary | ICD-10-CM

## 2015-07-07 DIAGNOSIS — Z7409 Other reduced mobility: Secondary | ICD-10-CM | POA: Diagnosis not present

## 2015-07-07 DIAGNOSIS — M25559 Pain in unspecified hip: Secondary | ICD-10-CM

## 2015-07-07 NOTE — Therapy (Signed)
[place] MAIN [facility] SERVICES [address] [place], [place], [address] Phone: [phone]   Fax:  [phone]  Physical Therapy Treatment  Patient Details  Name: Lauren Adkins MRN: [id number] Date of Birth: 02-11-1971 Referring Carie Kapuscinski:  Benjaman Kindler, MD  Encounter Date: 07/07/2015      PT End of Session - 07/07/15 1743    Visit Number 6   Number of Visits 12   Date for PT Re-Evaluation 07/22/15   PT Start Time 9741   PT Stop Time 1740   PT Time Calculation (min) 45 min   Activity Tolerance Patient tolerated treatment well;No increased pain     Behavior During Therapy [facility] for tasks assessed/performed      Past Medical History  Diagnosis Date  . Allergy     Past Surgical History  Procedure Laterality Date  . Ablation  2011    abdominal for heavy periods     There were no vitals filed for this visit.  Visit Diagnosis:  Impaired functional mobility and activity tolerance  Lack of coordination  Pain in joint, pelvic region and thigh, unspecified laterality      Subjective Assessment - 07/07/15 1659    Subjective Across the past 2 weeks, pt reported no tailbone except for one day when it started to rain. Pain level 3/10 which lasted only day. Pt reported "it is a tremendous improvement to be able to sit in the car for long periods of time and not experience pain."   Pt would like to learn how to maintain benefits of rehab to prevent painfrom coming back.  Pt reports she is a very great deal better based on the [facility].    Pertinent History "Broke tailbone" during childbirth 44 yo  (pain getting out of bed, sitting down) and resolved in 44 yo. No tears/episiotomy.     How long can you sit comfortably? 1.5 hrs   Patient Stated Goals 1) get back to "normalcy" 2) increased energy (from 7/10 fatigue level to 3-4/10)  to cook , clean, physical activity     Currently in Pain? No/denies            [facility] PT Assessment -  07/07/15 1734    Posture/Postural Control   Posture Comments minor cuing for dynamic stabilization 1-2    Palpation   Palpation comment coccyx medially aligned                     [facility] Adult PT Treatment/Exercise - 07/07/15 1734    Neuro Re-ed    Neuro Re-ed Details  dynamic sstabilization 1-2 10 reps each    Exercises   Other Exercises  quad stretch 5 breaths, sidelying w/ cues for stability of bottom foot, swimmers with moderate cuing for scapular depression activation and coordination w. deep core 10 reps  supine lower trunk rotation restorative pose w/ pillows. Bil                PT Education - 07/07/15 1743    Education provided Yes   Education Details HEP   Person(s) Educated Patient   Methods Explanation;Demonstration;Tactile cues;Verbal cues   Comprehension Verbalized understanding;Returned demonstration             PT Long Term Goals - 07/07/15 1701    PT LONG TERM GOAL #1   Title Pt will score on FSS an decrease from 78% to < 50%  in order to demo improved energy resesrve after work to  increase participation w/ family at home.  (06/09/15: 54% )    Time 12   Period Weeks   Status Partially Met   PT LONG TERM GOAL #2   Title Pt will be able to score an increase from 10% to > 30% on FSFI in order to improve QOL.    Time 12   Period Weeks   Status Achieved   PT LONG TERM GOAL #3   Title Pt will demo medially aligned coccyx and demo proper sitting posture w/o cuing across 2 visits in order to decrease pelvic girdle pain and increase ability to sit at her desk.   Time 12   Period Weeks   Status Achieved   PT LONG TERM GOAL #4   Title Pt will demo no pelvic instability with deep core strengthening (dynamic stabilization level 1-4) in order to progress to upright strengthening.    Time 12   Period Weeks   Status New   PT LONG TERM GOAL #5   Title Pt will demo no mm tensions/ tenderness R iliococcygeus and lateral aspect of sacrum/coccyx for 2  visits in order to minimize risk for relapse.    Time 12   Period Weeks   Status Achieved               Plan - 07/07/15 1744    Clinical Impression Statement Pt had only one short-lasting relapse episode across the past 6 weeks. Pt is progressing well towards the strengthening phase of her rehab as she showed no significant mm tensions and demo'd upright posture without cuing. Initated deep core coordination and strengthening exercises which pt tolerated without difficulty not pain. Pt to continue on every other week to provide pt more time for home practice.    Pt will benefit from skilled therapeutic intervention in order to improve on the following deficits Decreased activity tolerance;Decreased endurance;Decreased coordination;Decreased balance;Decreased range of motion;Decreased safety awareness;Impaired flexibility;Postural dysfunction;Improper body mechanics;Decreased strength;Increased muscle spasms;Hypomobility;Pain   Rehab Potential Good   Clinical Impairments Affecting Rehab Potential chronicity, sedentary job,    PT Frequency 1x / week   PT Duration 12 weeks   PT Treatment/Interventions ADLs/Self Care Home Management;Aquatic Therapy;Biofeedback;Cryotherapy;Electrical Stimulation;Moist Heat;Traction;Patient/family education;Neuromuscular re-education;Therapeutic exercise;Balance training;Therapeutic activities;Functional mobility training;Gait training;Manual techniques;Scar mobilization;Passive range of motion;Energy conservation;Dry needling   Consulted and Agree with Plan of Care Patient        Problem List Patient Active Problem List   Diagnosis Date Noted  . Microhematuria 06/01/2015  . Pelvic pain in female 06/01/2015  . Absolute anemia 12/26/2013  . Soft tissue lesion of shoulder region 12/26/2013  . Excessive and frequent menstruation 12/26/2013  . Anemia, iron deficiency 12/26/2013  . Cyst of ovary 12/26/2013  . Female genital symptoms 12/26/2013     Jerl Mina  ,PT, DPT, E-RYT  07/07/2015, 5:46 PM  [place] MAIN [facility] SERVICES [address] [place], [place], [address] Phone: [phone]   Fax:  [phone]

## 2015-07-07 NOTE — Patient Instructions (Signed)
Quad stretch in sidelying 5 breaths  Each side   Swimmer's 10 reps                                   Level 1-2   You are now ready to begin training the deep core muscles system: diaphragm, transverse abdominis, pelvic floor . These muscles must work together as a team.           The key to these exercises to train the brain to coordinate the timing of these muscles and to have them turn on for long periods of time to hold you upright against gravity (especially important if you are on your feet all day).These muscles are postural muscles and play a role stabilizing your spine and bodyweight. By doing these repetitions slowly and correctly instead of doing crunches, you will achieve a flatter belly without a lower pooch. You are also placing your spine in a more neutral position and breathing properly which in turn, decreases your risk for problems related to your pelvic floor, abdominal, and low back such as pelvic organ prolapse, hernias, diastasis recti (separation of superficial muscles), disk herniations, spinal fractures. These exercises set a solid foundation for you to later progress to resistance/ strength training with therabands and weights and return to other typical fitness exercises with a stronger deeper core.

## 2015-07-14 ENCOUNTER — Ambulatory Visit: Payer: BC Managed Care – PPO | Admitting: Physical Therapy

## 2015-07-21 ENCOUNTER — Encounter: Payer: BC Managed Care – PPO | Admitting: Physical Therapy

## 2015-07-28 ENCOUNTER — Encounter: Payer: BC Managed Care – PPO | Admitting: Physical Therapy

## 2015-07-30 ENCOUNTER — Ambulatory Visit: Payer: BC Managed Care – PPO | Attending: Obstetrics and Gynecology | Admitting: Physical Therapy

## 2015-07-30 DIAGNOSIS — M25559 Pain in unspecified hip: Secondary | ICD-10-CM | POA: Insufficient documentation

## 2015-07-30 DIAGNOSIS — R279 Unspecified lack of coordination: Secondary | ICD-10-CM

## 2015-07-30 DIAGNOSIS — Z7409 Other reduced mobility: Secondary | ICD-10-CM | POA: Insufficient documentation

## 2015-07-30 NOTE — Patient Instructions (Signed)
Abdominal massage (handout)   PELVIC FLOOR / KEGEL EXERCISES   Pelvic floor/ Kegel exercises are used to strengthen the muscles in the base of your pelvis that are responsible for supporting your pelvic organs and preventing urine/feces leakage. Based on your therapist's recommendations, they can be performed while standing, sitting, or lying down. Imagine pelvic floor area as a diamond with pelvic landmarks: top =pubic bone, bottom tip=tailbone, sides=sitting bones (ischial tuberosities).    Make yourself aware of this muscle group by using these cues while coordinating your breath:  Inhale, feel pelvic floor diamond area lower like hammock towards your feet and ribcage/belly expanding. Pause. Let the exhale naturally and feel your belly sink, abdominal muscles hugging in around you and you may notice the pelvic diamond draws upward towards your head forming a umbrella shape. Give a squeeze during the exhalation like you are stopping the flow of urine. If you are squeezing the buttock muscles, try to give 50% less effort.   Common Errors:  Breath holding: If you are holding your breath, you may be bearing down against your bladder instead of pulling it up. If you belly bulges up while you are squeezing, you are holding your breath. Be sure to breathe gently in and out while exercising. Counting out loud may help you avoid holding your breath.  Accessory muscle use: You should not see or feel other muscle movement when performing pelvic floor exercises. When done properly, no one can tell that you are performing the exercises. Keep the buttocks, belly and inner thighs relaxed.  Overdoing it: Your muscles can fatigue and stop working for you if you over-exercise. You may actually leak more or feel soreness at the lower abdomen or rectum.  YOUR HOME EXERCISE PROGRAM  LONG HOLDS: Position: on back  Inhale and then exhale. Then squeeze the muscle and count aloud for 6 seconds. Rest with three  long breaths. (Be sure to let belly sink in with exhales and not push outward)  Perform 2 repetitions, 3 times/day for 1-2 weeks, progress to 3 reps, 3x and so on...   SHORT HOLDS: Position: on back, sitting   Inhale and then exhale. Then squeeze the muscle.  (Be sure to let belly sink in with exhales and not push outward)  Perform with dynamic stabilization 1 (breathing exercise)                        DECREASE DOWNWARD PRESSURE ON  YOUR PELVIC FLOOR, ABDOMINAL, LOW BACK MUSCLES       PRESERVE YOUR PELVIC HEALTH LONG-TERM   ** SQUEEZE pelvic floor BEFORE YOUR SNEEZE, COUGH, LAUGH   ** EXHALE BEFORE YOU RISE AGAINST GRAVITY (lifting, sit to stand, from squat to stand)   ** LOG ROLL OUT OF BED INSTEAD OF CRUNCH/SIT-UP

## 2015-07-31 NOTE — Therapy (Deleted)
[place] [facility] [address] [place], [place], [address] Phone: [phone]   Fax:  [phone]  Patient Details  Name: Lauren Adkins MRN: [id number] Date of Birth: [birth year] Referring Provider:  Christeen DouglasBeasley, Bethany, MD  Encounter Date: 07/30/2015   Mariane MastersYeung,Shin Yiing ,PT, DPT, E-RYT  07/31/2015, 9:42 PM   [facility] [address] [place], [place], [address] Phone: [phone]   Fax:  [phone]

## 2015-08-03 NOTE — Therapy (Deleted)
[place] MAIN [facility] SERVICES [address] [place], [place], [address] Phone: [phone]   Fax:  [phone]  Physical Therapy Treatment/ Discharge Summary   Patient Details  Name: Lauren Adkins MRN: [id number] Date of Birth: Feb 14, 1971 No Data Recorded  Encounter Date: 07/30/2015    Past Medical History  Diagnosis Date  . Allergy     Past Surgical History  Procedure Laterality Date  . Ablation  2011    abdominal for heavy periods     There were no vitals filed for this visit.  Visit Diagnosis:  Impaired functional mobility and activity tolerance  Lack of coordination  Pain in joint, pelvic region and thigh, unspecified laterality          [facility] PT Assessment - 08/03/15 1039    Observation/Other Assessments   Other Surveys  --  FSS 33%                   Pelvic Floor Special Questions - 08/03/15 1045    Pelvic Floor Internal Exam pt verbally consented without contraindications   Exam Type Vaginal   Strength fair squeeze, definite lift  iniitally 2/5, post-Tx 3/5    Strength # of reps 2   Strength # of seconds 6   Biofeedback stronger activation            [facility] Adult PT Treatment/Exercise - 08/03/15 1042    Manual Therapy   Myofascial Release abdominal massage (also guided pt to self-massage)    Internal Pelvic Floor sustained pressure and thiele massage along R iliococcygeus   decreased tensions/ tenderness, increased strength                      PT Long Term Goals - 07/30/15 1711    PT LONG TERM GOAL #1   Title Pt will score on FSS an decrease from 78% to < 50%  in order to demo improved energy resesrve after work to increase participation w/ family at home.  (06/09/15: 54%, 07/30/15: 33% )    Time 12   Period Weeks   Status Achieved   PT LONG TERM GOAL #2   Title Pt will be able to score an increase from 10% to > 30% on FSFI in order to improve QOL.    Time 12   Period Weeks   Status Achieved   PT LONG TERM GOAL #3   Title Pt will demo medially aligned coccyx and demo proper sitting posture w/o cuing across 2 visits in order to decrease pelvic girdle pain and increase ability to sit at her desk.   Time 12   Period Weeks   Status Achieved   PT LONG TERM GOAL #4   Title Pt will demo no pelvic instability with deep core strengthening (dynamic stabilization level 1-4) in order to progress to upright strengthening.    Time 12   Period Weeks   Status Partially Met   PT LONG TERM GOAL #5   Title Pt will demo no mm tensions/ tenderness R iliococcygeus and lateral aspect of sacrum/coccyx for 2 visits in order to minimize risk for relapse.    Time 12   Period Weeks   Status Achieved               Plan - 08/03/15 1055    Clinical Impression Statement Pt has achieved 4/5 goals since the start of [place]. Pt feels 100% improvement with pelvic pain and has regained  ability to tolerate sitting and sexual intercourse without pain. Pt's score on the Fatigue Severity Scale and Female Sexual Function Scale have improved significantly.  Pt was educated on the importance of continuing with strengthening in order to minimize her risk for pelvic floor dysfunctions.  Pt demo'd  low pelvic floor strength and endurance but pt expressed being ready to get D/C at this time due to her busy work schedule. Pt was provided the next progression of deep core stabilization exercises along with pelvic floor endurance training which pt showed IND.  Pt is being D/C at this time.  Pt was a pleasure to work with as she remained motivated and compliant with her HEP despite her busy work schedule.     Pt will benefit from skilled therapeutic intervention in order to improve on the following deficits Decreased activity tolerance;Decreased endurance;Decreased coordination;Decreased balance;Decreased range of motion;Decreased safety awareness;Impaired flexibility;Postural dysfunction;Improper  body mechanics;Decreased strength;Increased muscle spasms;Hypomobility;Pain   Rehab Potential Good   Clinical Impairments Affecting Rehab Potential chronicity, sedentary job,    PT Frequency 1x / week   PT Duration 12 weeks   PT Treatment/Interventions ADLs/Self Care Home Management;Aquatic Therapy;Biofeedback;Cryotherapy;Electrical Stimulation;Moist Heat;Traction;Patient/family education;Neuromuscular re-education;Therapeutic exercise;Balance training;Therapeutic activities;Functional mobility training;Gait training;Manual techniques;Scar mobilization;Passive range of motion;Energy conservation;Dry needling   Consulted and Agree with Plan of Care Patient        Problem List Patient Active Problem List   Diagnosis Date Noted  . Microhematuria 06/01/2015  . Pelvic pain in female 06/01/2015  . Absolute anemia 12/26/2013  . Soft tissue lesion of shoulder region 12/26/2013  . Excessive and frequent menstruation 12/26/2013  . Anemia, iron deficiency 12/26/2013  . Cyst of ovary 12/26/2013  . Female genital symptoms 12/26/2013    Jerl Mina ,PT, DPT, E-RYT  08/03/2015, 11:10 AM  [place] MAIN [facility] SERVICES [address] [place], [place], [address] Phone: [phone]   Fax:  [phone]  Name: Lauren Adkins MRN: [id number] Date of Birth: [birth year]

## 2015-08-03 NOTE — Therapy (Signed)
[place] [facility] MAIN REHAB SERVICES [address] [place], [place], [address] Phone: [phone]   Fax:  [phone]  Physical Therapy Treatment/Discharge Summary    Patient Details  Name: Lauren Adkins MRN: [id number] Date of Birth: 12/20/1970 No Data Recorded  Encounter Date: 07/30/2015      PT End of Session - 08/03/15 1114    Visit Number 7   Number of Visits 12   Date for PT Re-Evaluation 07/22/15   PT Start Time 1700   PT Stop Time 1800   PT Time Calculation (min) 60 min   Activity Tolerance Patient tolerated treatment well;No increased pain  reported decreased pain from 7/10 to 3/10 post Tx   Behavior During Therapy WFL for tasks assessed/performed      Past Medical History  Diagnosis Date  . Allergy     Past Surgical History  Procedure Laterality Date  . Ablation  2011    abdominal for heavy periods     There were no vitals filed for this visit.  Visit Diagnosis:  Impaired functional mobility and activity tolerance  Lack of coordination  Pain in joint, pelvic region and thigh, unspecified laterality      Subjective Assessment - 08/03/15 1112    Subjective Since SOC, pt stated she feels 100% better and based on the GROC, pt feels a great deal better. (7) Pt has not been doing her strengthening exercises but has been correcting posture throughout the day. Pt returned to sitting and walking at work without pain as well as having sexual intercourse without pain.     Pertinent History "Broke tailbone" during childbirth 44 yo  (pain getting out of bed, sitting down) and resolved in 44 yo. No tears/episiotomy.     How long can you sit comfortably? 1.5 hrs   Patient Stated Goals 1) get back to "normalcy" 2) increased energy (from 7/10 fatigue level to 3-4/10)  to cook , clean, physical activity              [facility] PT Assessment - 08/03/15 1039    Observation/Other Assessments   Other Surveys  --  FSS 33%                    Pelvic Floor Special Questions - 08/03/15 1045    Pelvic Floor Internal Exam pt verbally consented without contraindications   Exam Type Vaginal   Strength fair squeeze, definite lift  initally 2/5, post-Tx 3/5    Strength # of reps 2   Strength # of seconds 6   Biofeedback stronger activation with verbal, tactile cues, thiele massage to relax R iliococcygeus            [facility] Adult PT Treatment/Exercise - 08/03/15 1042    Manual Therapy   Myofascial Release abdominal massage (also guided pt to self-massage)    Internal Pelvic Floor sustained pressure and thiele massage along R iliococcygeus   decreased tensions/ tenderness, increased strength                      PT Long Term Goals - 07/30/15 1711    PT LONG TERM GOAL #1   Title Pt will score on FSS an decrease from 78% to < 50%  in order to demo improved energy resesrve after work to increase participation w/ family at home.  (06/09/15: 54%, 07/30/15: 33% )    Time 12   Period Weeks   Status Achieved     PT LONG TERM GOAL #2   Title Pt will be able to score an increase from 10% to > 30% on FSFI in order to improve QOL.    Time 12   Period Weeks   Status Achieved   PT LONG TERM GOAL #3   Title Pt will demo medially aligned coccyx and demo proper sitting posture w/o cuing across 2 visits in order to decrease pelvic girdle pain and increase ability to sit at her desk.   Time 12   Period Weeks   Status Achieved   PT LONG TERM GOAL #4   Title Pt will demo no pelvic instability with deep core strengthening (dynamic stabilization level 1-4) in order to progress to upright strengthening.    Time 12   Period Weeks   Status Partially Met   PT LONG TERM GOAL #5   Title Pt will demo no mm tensions/ tenderness R iliococcygeus and lateral aspect of sacrum/coccyx for 2 visits in order to minimize risk for relapse.    Time 12   Period Weeks   Status Achieved               Plan -  08/03/15 1055    Clinical Impression Statement Across 7 visits, pt has achieved 4/5 goals since the start of [place]. Pt feels 100% improvement with pelvic pain and pain and has regained ability to tolerate sitting and sexual intercourse without pain. Pt's score on Fatigue Severity Scale and Female Sexual Function Scale have improved significantly.  Pt was educated on the importance of continuing with strengthening in order to minimize her risk for pelvic floor dysfunctions.  Pt demo'd  low pelvic floor strength and endurance but pt expressed being ready to get D/C at this time due to her busy work schedule. Pt was provided the next progression of deep core stabilization exercises along with pelvic floor endurance training which pt showed IND.  Pt is being D/C at this time.      Pt will benefit from skilled therapeutic intervention in order to improve on the following deficits Decreased activity tolerance;Decreased endurance;Decreased coordination;Decreased balance;Decreased range of motion;Decreased safety awareness;Impaired flexibility;Postural dysfunction;Improper body mechanics;Decreased strength;Increased muscle spasms;Hypomobility;Pain   Rehab Potential Good   Clinical Impairments Affecting Rehab Potential chronicity, sedentary job,    PT Frequency 1x / week   PT Duration 12 weeks   PT Treatment/Interventions ADLs/Self Care Home Management;Aquatic Therapy;Biofeedback;Cryotherapy;Electrical Stimulation;Moist Heat;Traction;Patient/family education;Neuromuscular re-education;Therapeutic exercise;Balance training;Therapeutic activities;Functional mobility training;Gait training;Manual techniques;Scar mobilization;Passive range of motion;Energy conservation;Dry needling   Consulted and Agree with Plan of Care Patient        Problem List Patient Active Problem List   Diagnosis Date Noted  . Microhematuria 06/01/2015  . Pelvic pain in female 06/01/2015  . Absolute anemia 12/26/2013  . Soft tissue  lesion of shoulder region 12/26/2013  . Excessive and frequent menstruation 12/26/2013  . Anemia, iron deficiency 12/26/2013  . Cyst of ovary 12/26/2013  . Female genital symptoms 12/26/2013    Jerl Mina  ,PT, DPT, E-RYT  08/03/2015, 11:15 AM  [place] MAIN [facility] SERVICES [address] [place], [place], [address] Phone: [phone]   Fax:  [phone]  Name: Lauren Adkins MRN: [id number] Date of Birth: 24-Mar-1971

## 2015-08-04 ENCOUNTER — Encounter: Payer: BC Managed Care – PPO | Admitting: Physical Therapy

## 2015-09-30 ENCOUNTER — Other Ambulatory Visit: Payer: BC Managed Care – PPO | Admitting: Urology

## 2015-10-01 ENCOUNTER — Ambulatory Visit (INDEPENDENT_AMBULATORY_CARE_PROVIDER_SITE_OTHER): Payer: BC Managed Care – PPO | Admitting: Urology

## 2015-10-01 ENCOUNTER — Encounter: Payer: Self-pay | Admitting: Urology

## 2015-10-01 VITALS — BP 122/82 | HR 81 | Ht 70.0 in | Wt 191.2 lb

## 2015-10-01 DIAGNOSIS — R3129 Other microscopic hematuria: Secondary | ICD-10-CM

## 2015-10-01 LAB — MICROSCOPIC EXAMINATION: Bacteria, UA: NONE SEEN

## 2015-10-01 LAB — URINALYSIS, COMPLETE
BILIRUBIN UA: NEGATIVE
Glucose, UA: NEGATIVE
KETONES UA: NEGATIVE
NITRITE UA: NEGATIVE
Protein, UA: NEGATIVE
SPEC GRAV UA: 1.01 (ref 1.005–1.030)
UUROB: 0.2 mg/dL (ref 0.2–1.0)
pH, UA: 7 (ref 5.0–7.5)

## 2015-10-01 MED ORDER — LIDOCAINE HCL 2 % EX GEL
1.0000 "application " | Freq: Once | CUTANEOUS | Status: AC
  Administered 2015-10-01: 1 via URETHRAL

## 2015-10-01 MED ORDER — CIPROFLOXACIN HCL 500 MG PO TABS
500.0000 mg | ORAL_TABLET | Freq: Once | ORAL | Status: AC
  Administered 2015-10-01: 500 mg via ORAL

## 2015-10-01 NOTE — Progress Notes (Signed)
10/01/2015 9:02 AM   Lauren Adkins [birth year] [id number]  Referring provider: Lauro RegulusMarshall W Anderson, MD [address] [facility] [place], [place] [address]  Chief Complaint  Patient presents with  . Cysto    HPI: Patient seen today for several issues refer by PCP Dr. Dareen PianoAnderson. GYN Dr. Dalbert GarnetBeasley.  1-microscopic hematuria-urinalyses over July 20 15th of July 2016 had shown anywhere from 4-10 red blood cells per high-powered field up to 10-50 red blood cells per high-powered field. Patient underwent a recent CT scan without IV contrast which showed a benign urinary tract. It did show enteritis with some mesenteric stranding and some nonspecific lymphadenopathy in the abdomen. She also had a 2.8 cm left adnexal cyst. Her BUN was 6 and creatinine 0.7.  No gross hematuria. No exposure risk. She has a h/o of what sounds like nephrocalcinosis and was seen by Dr. Achilles Dunkope, but no stones on recent CT stone protocol.   2- Patient has some baseline urinary frequency and urgency - she was treated about 16 yrs ago with medication.   3-pelvic pain-patient was seen recently by her GYN Dr. Dalbert GarnetBeasley and complained of pain in her pelvis and dyspareunia. Patient had a normal pelvic exam with a nontender bladder and adnexa but was tender along the pelvic floor including the piriformis and the urethra. She was appropriately referred to physical therapy which she completed in October 2016.   PMH: Past Medical History  Diagnosis Date  . Allergy   . Cyst of ovary 12/26/2013  . Female genital symptoms 12/26/2013  . Microhematuria 06/01/2015  . Absolute anemia 12/26/2013  . Anemia, iron deficiency 12/26/2013    Surgical History: Past Surgical History  Procedure Laterality Date  . Ablation  2011    abdominal for heavy periods     Home Medications:    Medication List       This list is accurate as of: 10/01/15  9:02 AM.  Always use your most recent med list.               acyclovir 400 MG tablet  Commonly known as:  ZOVIRAX  Take 1 tablet by mouth 3 (three) times daily. Reported on 10/01/2015     B-D 3CC LUER-LOK SYR 25GX1" 25G X 1" 3 ML Misc  Generic drug:  SYRINGE-NEEDLE (DISP) 3 ML  Reported on 10/01/2015     cyanocobalamin 1000 MCG/ML injection  Commonly known as:  (VITAMIN B-12)  Inject into the muscle. Reported on 10/01/2015     fexofenadine 30 MG disintegrating tablet  Commonly known as:  ALLEGRA ODT  Take 30 mg by mouth. Reported on 10/01/2015     PROAIR HFA 108 (90 BASE) MCG/ACT inhaler  Generic drug:  albuterol  Inhale into the lungs. Reported on 10/01/2015        Allergies:  Allergies  Allergen Reactions  . Iron Nausea Only    Oral Fe - extreme nausea  . Sulfa Antibiotics Other (See Comments) and Rash    Swelling of throat    Family History: Family History  Problem Relation Age of Onset  . Hypertension Father   . Cancer Father   . Breast cancer Maternal Aunt 85  . Breast cancer Maternal Grandmother [phone]    Social History:  reports that she has never smoked. She has never used smokeless tobacco. She reports that she does not drink alcohol or use illicit drugs.  ROS: UROLOGY Frequent Urination?: Yes Hard to postpone urination?: Yes Burning/pain  with urination?: No Get up at night to urinate?: Yes Leakage of urine?: No Urine stream starts and stops?: No Trouble starting stream?: No Do you have to strain to urinate?: No Blood in urine?: Yes Urinary tract infection?: No Sexually transmitted disease?: No Injury to kidneys or bladder?: No Painful intercourse?: No Weak stream?: No Currently pregnant?: No Vaginal bleeding?: No Last menstrual period?: 3 weeks ago  Gastrointestinal Nausea?: No Vomiting?: No Indigestion/heartburn?: No Diarrhea?: Yes Constipation?: No  Constitutional Fever: No Night sweats?: No Weight loss?: No Fatigue?: No  Skin Skin rash/lesions?: No Itching?: No  Eyes Blurred vision?:  No Double vision?: No  Ears/Nose/Throat Sore throat?: No Sinus problems?: No  Hematologic/Lymphatic Swollen glands?: No Easy bruising?: Yes  Cardiovascular Leg swelling?: Yes Chest pain?: No  Respiratory Cough?: No Shortness of breath?: No  Endocrine Excessive thirst?: No  Musculoskeletal Back pain?: No Joint pain?: No  Neurological Headaches?: Yes Dizziness?: Yes  Psychologic Depression?: No Anxiety?: No  Physical Exam: BP 122/82 mmHg  Pulse 81  Ht  (1.778 m)  Wt 191 lb 3.2 oz (86.728 kg)  BMI 27.43 kg/m2  Constitutional:  Alert and oriented, No acute distress. HEENT: [place] AT, moist mucus membranes.  Trachea midline, no masses. Cardiovascular: No clubbing, cyanosis, or edema. Respiratory: Normal respiratory effort, no increased work of breathing. GI: Abdomen is soft, nontender, nondistended, no abdominal masses GU: No CVA tenderness.  Skin: No rashes, bruises or suspicious lesions. Lymph: No cervical or inguinal adenopathy. Neurologic: Grossly intact, no focal deficits, moving all 4 extremities. Psychiatric: Normal mood and affect.  Laboratory Data: Lab Results  Component Value Date   WBC 11.1* 08/15/2014   HGB 14.4 08/15/2014   HCT 42.7 08/15/2014   MCV 92 08/15/2014   PLT 215 08/15/2014    No results found for: CREATININE  No results found for: PSA  No results found for: TESTOSTERONE  No results found for: HGBA1C  Urinalysis    Component Value Date/Time   GLUCOSEU Negative 06/01/2015 1439   BILIRUBINUR Negative 06/01/2015 1439   NITRITE Negative 06/01/2015 1439   LEUKOCYTESUR Trace* 06/01/2015 1439    Cystoscopy Procedure Note  Patient identification was confirmed, informed consent was obtained, and patient was prepped using Betadine solution.  Lidocaine jelly was administered per urethral meatus.    Preoperative abx where received prior to procedure.    Procedure: - Flexible cystoscope introduced, without any difficulty.    - Thorough search of the bladder revealed:    normal urethral meatus    normal urothelium    no stones    no ulcers     no tumors    no urethral polyps    no trabeculation  - Ureteral orifices were normal in position and appearance.  Post-Procedure: - Patient tolerated the procedure well  Assessment & Plan:    #1 microscopic hematuria- normal cystoscopy. Normal CT stone protocol. I did discuss with the patient that technically speaking we still had to evaluate her upper tracts with contrast and delayed films. This would complete her hematuria workup according to the AUA guidelines. She is not interested in further imaging at this time. We will have her follow-up in one year for urinalysis at that time.  #2 urinary frequency, urgency-improved after PT.   #3 pelvic pain-Improved after pelvic floor PT.  Return in about 1 year (around 09/30/2016) for with urinalysis.  Hildred Laser, MD  [facility] [address], [address] [place], [place] [address] [phone]

## 2016-05-18 ENCOUNTER — Other Ambulatory Visit: Payer: Self-pay | Admitting: Obstetrics and Gynecology

## 2016-05-18 DIAGNOSIS — Z1231 Encounter for screening mammogram for malignant neoplasm of breast: Secondary | ICD-10-CM

## 2016-06-20 ENCOUNTER — Ambulatory Visit: Payer: BC Managed Care – PPO

## 2016-07-26 ENCOUNTER — Other Ambulatory Visit: Payer: Self-pay | Admitting: Obstetrics and Gynecology

## 2016-07-26 ENCOUNTER — Ambulatory Visit
Admission: RE | Admit: 2016-07-26 | Discharge: 2016-07-26 | Disposition: A | Discharge status: Home / Self Care | Payer: BC Managed Care – PPO | Source: Ambulatory Visit | Attending: Obstetrics and Gynecology | Admitting: Obstetrics and Gynecology

## 2016-07-26 DIAGNOSIS — Z1231 Encounter for screening mammogram for malignant neoplasm of breast: Secondary | ICD-10-CM | POA: Diagnosis present

## 2016-09-30 ENCOUNTER — Ambulatory Visit: Payer: BC Managed Care – PPO

## 2016-12-31 IMAGING — CT CT RENAL STONE PROTOCOL
2 of 4 series · 16 of 46 positions shown, 18 images · non-contrast
Comparison: Prior unenhanced CT of the abdomen and pelvis on
12/13/2006 and contrast enhanced study on 12/14/2006.

CLINICAL DATA: Lower abdominal pain, nausea and gross hematuria.

EXAM:
CT ABDOMEN AND PELVIS WITHOUT CONTRAST
TECHNIQUE: Multidetector CT imaging of the abdomen and pelvis was performed
following the standard protocol without IV contrast.

[Series 2: stone · axial · 0.68mm/px · z∈[-1004,-574]mm · 13 of 94 slices shown, 15 images]
[im 4/94  soft-tissue]
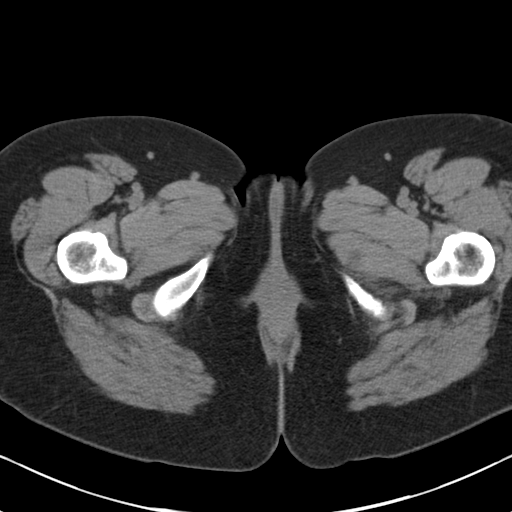
[im 4/94  bone]
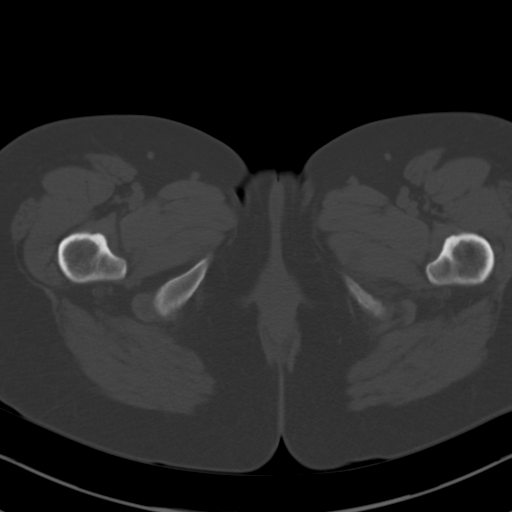
[im 12/94  soft-tissue]
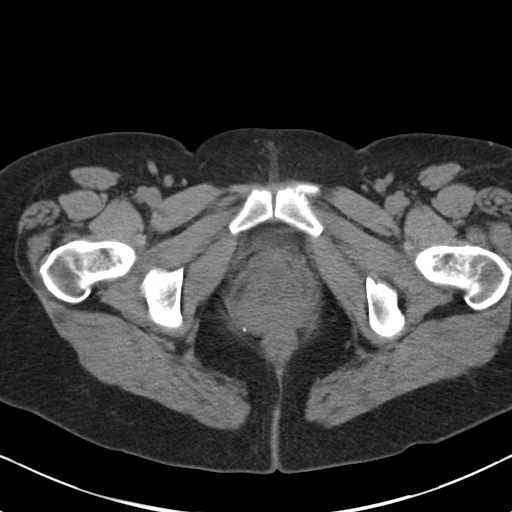
[im 20/94  soft-tissue]
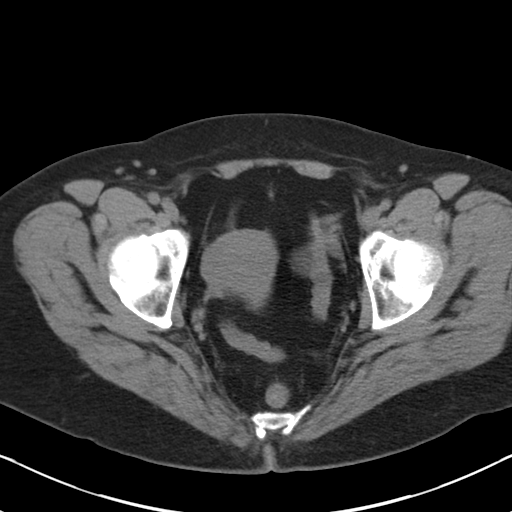
[im 28/94  soft-tissue]
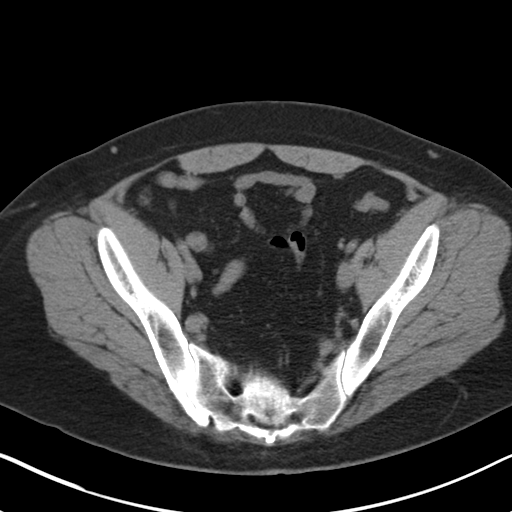
[im 32/94  soft-tissue]
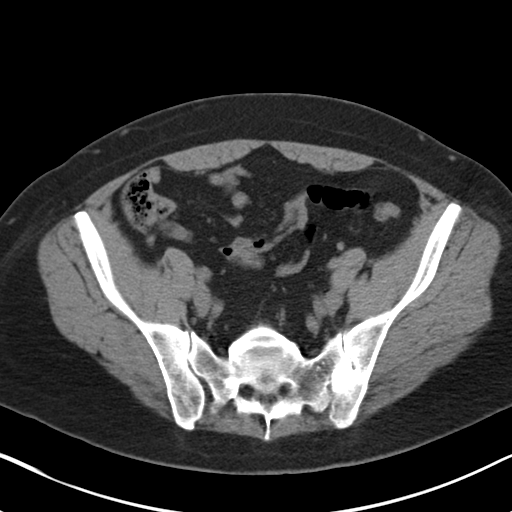
[im 39/94  soft-tissue]
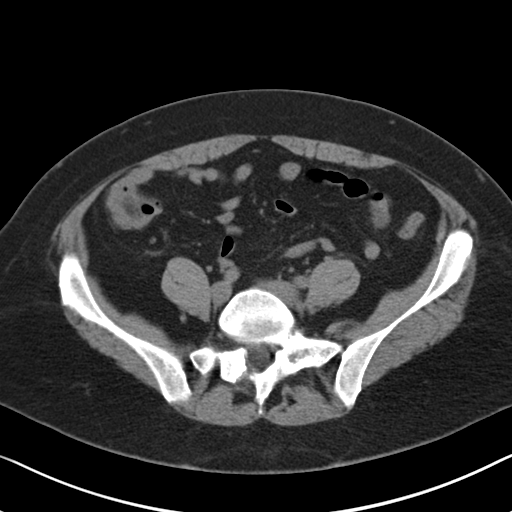
[im 47/94  soft-tissue]
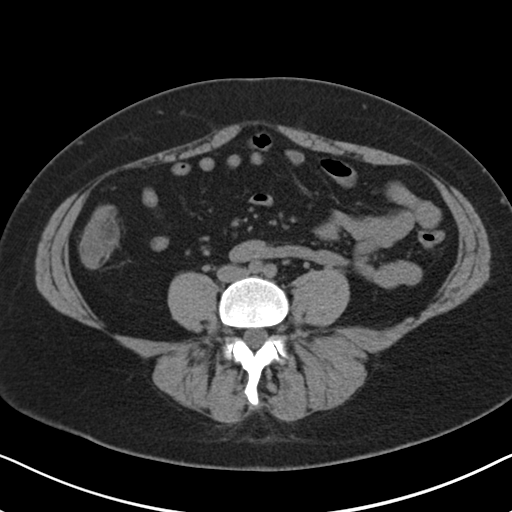
[im 55/94  soft-tissue]
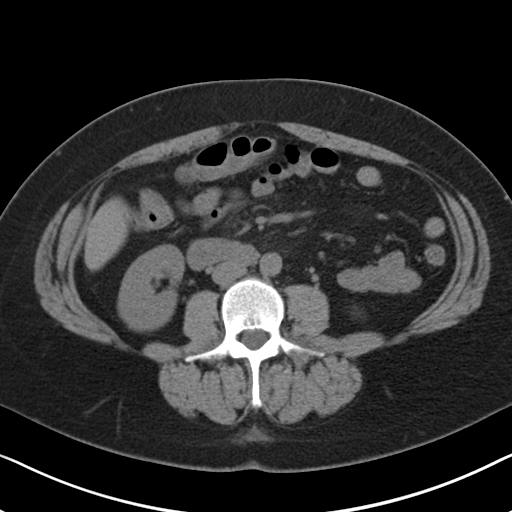
[im 63/94  soft-tissue]
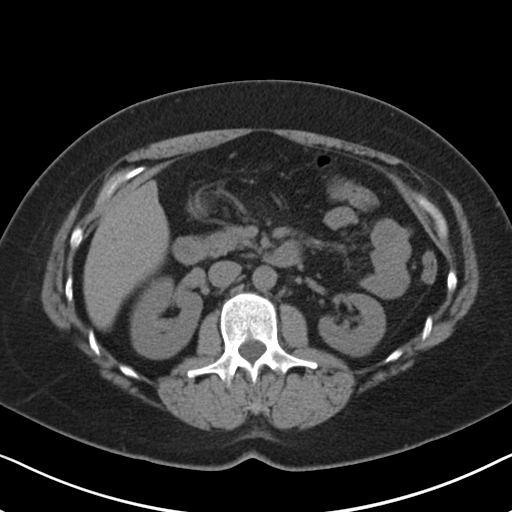
[im 63/94  bone]
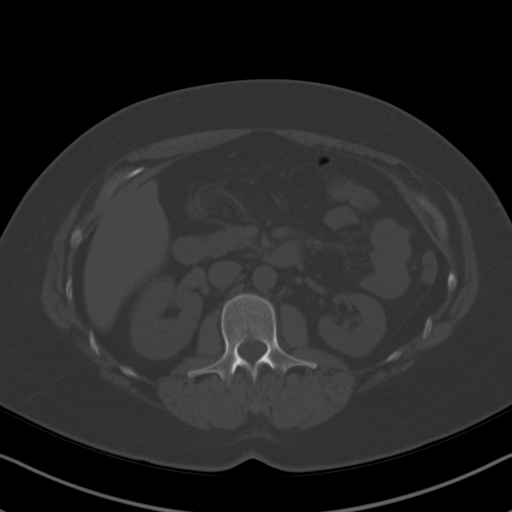
[im 66/94  soft-tissue]
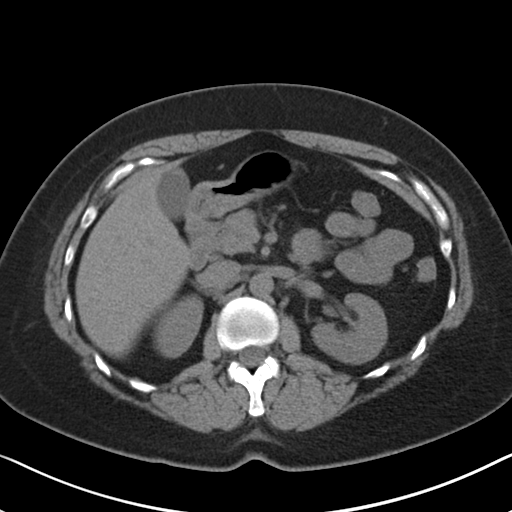
[im 74/94  soft-tissue]
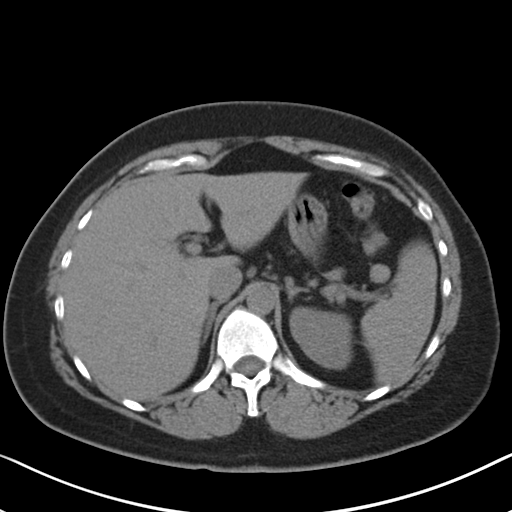
[im 82/94  soft-tissue]
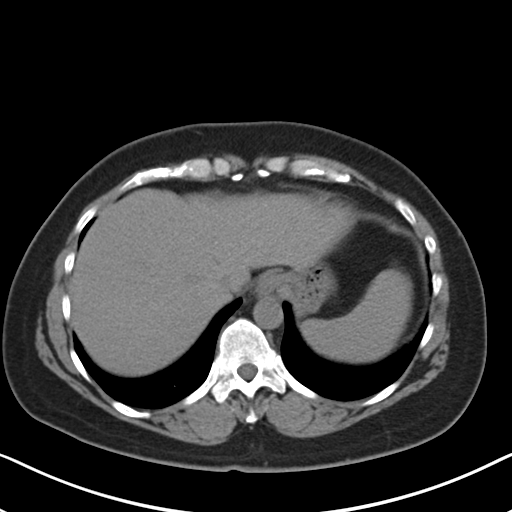
[im 90/94  soft-tissue]
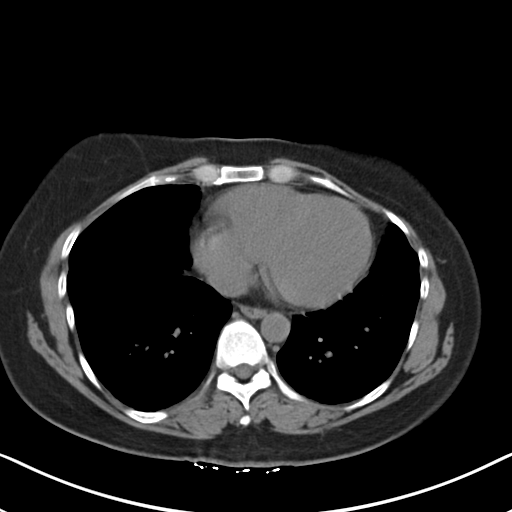

[Series 5: cor · coronal · 0.69mm/px · 3 of 137 slices shown]
[im 46/137  soft-tissue]
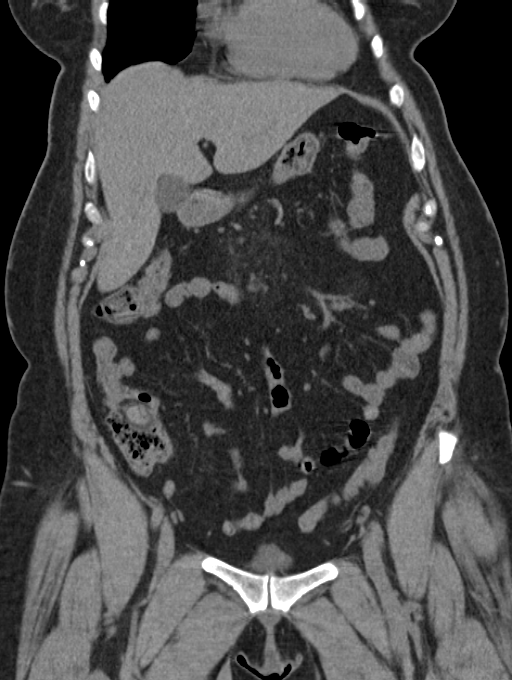
[im 61/137  soft-tissue]
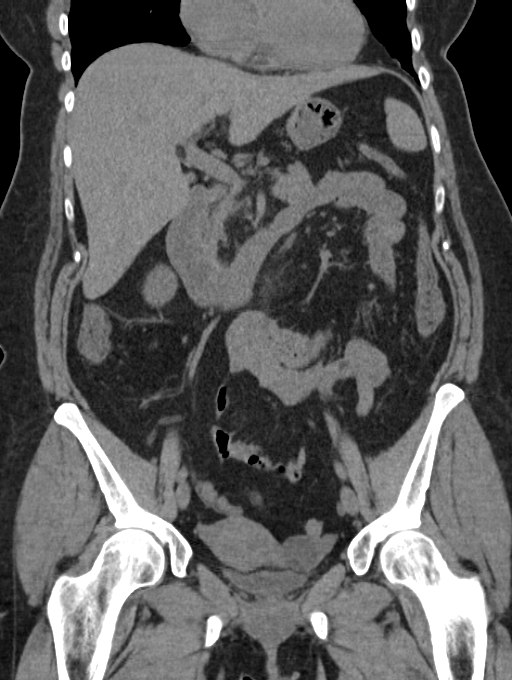
[im 76/137  soft-tissue]
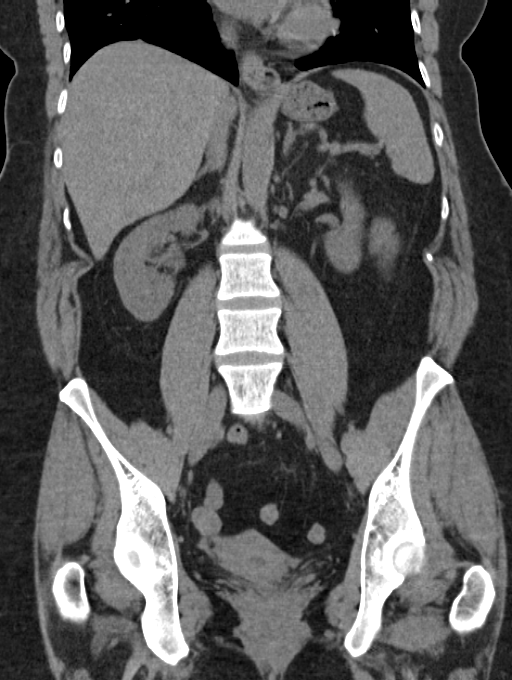

[16 of 46 positions shown; findings below may reference images not displayed]

FINDINGS: Small hiatal hernia present. Visualized lung bases are normal.
Unenhanced appearance of the liver, gallbladder, pancreas, spleen,
adrenal glands and kidneys are unremarkable. No evidence of urinary
tract calculi or hydronephrosis. The bladder is nearly decompressed
and unremarkable in appearance.

Unopacified bowel loops are normal in caliber. There is no evidence
to suggest bowel obstruction or ileus. No free air, focal abscess or
free fluid is seen. There is nonspecific haziness in the central
mesenteric fat not appreciated on the prior study. There are some
visualized nonenlarged mesenteric lymph nodes. Findings are
nonspecific but may reflect inflammation from enteritis.

No masses or enlarged lymph nodes are seen. No hernias are
identified. The uterus and adnexal regions are unremarkable. There
is a focal cyst in the left adnexal region measuring 2.8 cm and
demonstrating internal simple cystic fluid density. This is likely a
physiologic ovarian cyst. Bony structures are unremarkable.
IMPRESSION: 1. Nonspecific haziness in the central mesenteric fat with some
associated nonenlarged mesenteric lymph nodes. This may reflect
inflammation from enteritis.
2. No evidence of bowel obstruction or ileus. No focal abscess
identified.
3. No evidence of urinary tract calculi or hydronephrosis.
4. 2.8 cm left adnexal cyst is likely a benign physiologic cyst.

## 2017-07-18 ENCOUNTER — Other Ambulatory Visit: Payer: Self-pay | Admitting: Obstetrics and Gynecology

## 2017-07-18 DIAGNOSIS — Z1231 Encounter for screening mammogram for malignant neoplasm of breast: Secondary | ICD-10-CM

## 2017-08-21 ENCOUNTER — Ambulatory Visit
Admission: RE | Admit: 2017-08-21 | Discharge: 2017-08-21 | Disposition: A | Discharge status: Home / Self Care | Payer: BC Managed Care – PPO | Source: Ambulatory Visit | Attending: Obstetrics and Gynecology | Admitting: Obstetrics and Gynecology

## 2017-08-21 DIAGNOSIS — Z1231 Encounter for screening mammogram for malignant neoplasm of breast: Secondary | ICD-10-CM | POA: Diagnosis present

## 2019-09-30 ENCOUNTER — Other Ambulatory Visit: Payer: Self-pay | Admitting: Obstetrics and Gynecology

## 2019-09-30 DIAGNOSIS — Z1231 Encounter for screening mammogram for malignant neoplasm of breast: Secondary | ICD-10-CM

## 2019-10-02 ENCOUNTER — Ambulatory Visit
Admission: RE | Admit: 2019-10-02 | Discharge: 2019-10-02 | Disposition: A | Discharge status: Home / Self Care | Payer: BC Managed Care – PPO | Source: Ambulatory Visit | Attending: Obstetrics and Gynecology | Admitting: Obstetrics and Gynecology

## 2019-10-02 DIAGNOSIS — Z1231 Encounter for screening mammogram for malignant neoplasm of breast: Secondary | ICD-10-CM | POA: Insufficient documentation

## 2020-03-14 ENCOUNTER — Ambulatory Visit: Payer: BC Managed Care – PPO

## 2020-03-14 ENCOUNTER — Ambulatory Visit: Payer: BC Managed Care – PPO | Attending: Internal Medicine

## 2020-03-14 DIAGNOSIS — Z23 Encounter for immunization: Secondary | ICD-10-CM

## 2020-03-14 NOTE — Progress Notes (Signed)
   Covid-19 Vaccination Clinic  Name:  NECHELLE PETRIZZO    MRN: [id number] DOB: [birth year]  03/14/2020  Ms. Albright-Johnson was observed post Covid-19 immunization for 15 minutes without incident. She was provided with Vaccine Information Sheet and instruction to access the V-Safe system.   Ms. Delancey was instructed to call 911 with any severe reactions post vaccine: Marland Kitchen Difficulty breathing  . Swelling of face and throat  . A fast heartbeat  . A bad rash all over body  . Dizziness and weakness   Immunizations Administered    Name Date Dose VIS Date Route   Pfizer COVID-19 Vaccine 03/14/2020  9:22 AM 0.3 mL 12/04/2018 Intramuscular   Manufacturer: [employer], [employer]   Lot: [id number]   NDC: [address]-7

## 2020-08-07 ENCOUNTER — Other Ambulatory Visit: Payer: Self-pay | Admitting: Obstetrics and Gynecology

## 2020-08-07 DIAGNOSIS — Z1231 Encounter for screening mammogram for malignant neoplasm of breast: Secondary | ICD-10-CM

## 2020-10-26 ENCOUNTER — Ambulatory Visit
Admission: RE | Admit: 2020-10-26 | Discharge: 2020-10-26 | Disposition: A | Discharge status: Home / Self Care | Payer: BC Managed Care – PPO | Source: Ambulatory Visit | Attending: Obstetrics and Gynecology | Admitting: Obstetrics and Gynecology

## 2020-10-26 ENCOUNTER — Other Ambulatory Visit: Payer: Self-pay

## 2020-10-26 DIAGNOSIS — Z1231 Encounter for screening mammogram for malignant neoplasm of breast: Secondary | ICD-10-CM | POA: Insufficient documentation

## 2021-09-16 ENCOUNTER — Other Ambulatory Visit: Payer: Self-pay | Admitting: Obstetrics and Gynecology

## 2021-09-16 DIAGNOSIS — Z1231 Encounter for screening mammogram for malignant neoplasm of breast: Secondary | ICD-10-CM

## 2021-10-07 ENCOUNTER — Ambulatory Visit: Payer: BC Managed Care – PPO | Admitting: Urology

## 2021-11-18 ENCOUNTER — Other Ambulatory Visit: Payer: Self-pay

## 2021-11-18 ENCOUNTER — Ambulatory Visit
Admission: RE | Admit: 2021-11-18 | Discharge: 2021-11-18 | Disposition: A | Discharge status: Home / Self Care | Payer: BC Managed Care – PPO | Source: Ambulatory Visit | Attending: Obstetrics and Gynecology | Admitting: Obstetrics and Gynecology

## 2021-11-18 DIAGNOSIS — Z1231 Encounter for screening mammogram for malignant neoplasm of breast: Secondary | ICD-10-CM | POA: Diagnosis not present

## 2022-11-21 ENCOUNTER — Other Ambulatory Visit: Payer: Self-pay

## 2022-11-21 DIAGNOSIS — Z1231 Encounter for screening mammogram for malignant neoplasm of breast: Secondary | ICD-10-CM

## 2023-01-06 ENCOUNTER — Other Ambulatory Visit: Payer: Self-pay | Admitting: Internal Medicine

## 2023-01-06 ENCOUNTER — Ambulatory Visit
Admission: RE | Admit: 2023-01-06 | Discharge: 2023-01-06 | Disposition: A | Discharge status: Home / Self Care | Payer: BC Managed Care – PPO | Source: Ambulatory Visit | Attending: Internal Medicine | Admitting: Internal Medicine

## 2023-01-06 DIAGNOSIS — Z1231 Encounter for screening mammogram for malignant neoplasm of breast: Secondary | ICD-10-CM

## 2023-01-10 ENCOUNTER — Other Ambulatory Visit: Payer: Self-pay | Admitting: Internal Medicine

## 2023-01-10 DIAGNOSIS — R928 Other abnormal and inconclusive findings on diagnostic imaging of breast: Secondary | ICD-10-CM

## 2023-01-12 ENCOUNTER — Ambulatory Visit
Admission: RE | Admit: 2023-01-12 | Discharge: 2023-01-12 | Disposition: A | Discharge status: Home / Self Care | Payer: BC Managed Care – PPO | Source: Ambulatory Visit | Attending: Internal Medicine | Admitting: Internal Medicine

## 2023-01-12 DIAGNOSIS — R928 Other abnormal and inconclusive findings on diagnostic imaging of breast: Secondary | ICD-10-CM | POA: Diagnosis present

## 2023-03-15 ENCOUNTER — Other Ambulatory Visit: Payer: Self-pay

## 2023-03-15 DIAGNOSIS — R1031 Right lower quadrant pain: Secondary | ICD-10-CM

## 2023-03-28 ENCOUNTER — Other Ambulatory Visit: Payer: Self-pay | Admitting: Internal Medicine

## 2023-03-28 DIAGNOSIS — R1031 Right lower quadrant pain: Secondary | ICD-10-CM

## 2023-03-31 ENCOUNTER — Ambulatory Visit: Admission: RE | Admit: 2023-03-31 | Payer: BC Managed Care – PPO | Source: Ambulatory Visit

## 2023-05-20 ENCOUNTER — Ambulatory Visit
Admission: EM | Admit: 2023-05-20 | Discharge: 2023-05-20 | Disposition: A | Discharge status: Home / Self Care | Payer: BC Managed Care – PPO | Source: Home / Self Care

## 2023-05-20 DIAGNOSIS — H9201 Otalgia, right ear: Secondary | ICD-10-CM

## 2023-05-20 DIAGNOSIS — R3129 Other microscopic hematuria: Secondary | ICD-10-CM

## 2023-05-20 DIAGNOSIS — R300 Dysuria: Secondary | ICD-10-CM

## 2023-05-20 DIAGNOSIS — R519 Headache, unspecified: Secondary | ICD-10-CM

## 2023-05-20 LAB — POCT URINALYSIS DIP (MANUAL ENTRY)
Bilirubin, UA: NEGATIVE
Glucose, UA: NEGATIVE mg/dL
Ketones, POC UA: NEGATIVE mg/dL
Leukocytes, UA: NEGATIVE
Nitrite, UA: NEGATIVE
Protein Ur, POC: NEGATIVE mg/dL
Spec Grav, UA: 1.015 (ref 1.010–1.025)
Urobilinogen, UA: 0.2 E.U./dL
pH, UA: 6 (ref 5.0–8.0)

## 2023-05-20 MED ORDER — KETOROLAC TROMETHAMINE 30 MG/ML IJ SOLN
30.0000 mg | Freq: Once | INTRAMUSCULAR | Status: AC
  Administered 2023-05-20: 30 mg via INTRAMUSCULAR

## 2023-05-20 NOTE — ED Triage Notes (Signed)
Patient to Urgent Care with complaints of persistent headache for ten days. Also with complaints of right sided ear pressure that started yesterday. Has not taken any medications today other than xyzal. (Excedrin yesterday).  Also w/ complaints of dysuria today. Denies any urgency or frequency.

## 2023-05-20 NOTE — Discharge Instructions (Addendum)
You were given an injection of Toradol today for your headache.  Follow-up with your primary care provider on Monday.  Go to the emergency department if you have persistent or worsening symptoms.    See the attached information on headache, earache, dysuria, hematuria.

## 2023-05-20 NOTE — ED Provider Notes (Signed)
Renaldo Fiddler    CSN: [id number] Arrival date & time: 05/20/23  1006      History   Chief Complaint Chief Complaint  Patient presents with   Headache   Otalgia    HPI Lauren Adkins is a 52 y.o. female.  Patient presents with 10-day history of headache.  The headache waxes and wanes.  It is worse when she is at work; she works at a [employer].  No alleviating factors identified.  The headache is bilateral and primarily frontal.  No falls or injury.  She reports remote history of frequent headaches but this is improved in the last 10 years.  This is not the worst headache of her life.  No numbness, weakness, vision change, chest pain, shortness of breath.  Patient also presents with right ear pain x 1 day.  No ear drainage, fever, sore throat, cough.  Patient also presents with dysuria today.  No abdominal pain, urinary frequency, flank pain, vaginal symptoms.  Her medical history includes microhematuria, anemia, ovarian cyst, allergies.  Patient has taken Xyzal today but no other OTC medications.  The history is provided by the patient and medical records.    Past Medical History:  Diagnosis Date   Absolute anemia 12/26/2013   Allergy    Anemia, iron deficiency 12/26/2013   Cyst of ovary 12/26/2013   Female genital symptoms 12/26/2013   Microhematuria 06/01/2015    Patient Active Problem List   Diagnosis Date Noted   Microhematuria 06/01/2015   Pelvic pain in female 06/01/2015   Absolute anemia 12/26/2013   Soft tissue lesion of shoulder region 12/26/2013   Excessive and frequent menstruation 12/26/2013   Anemia, iron deficiency 12/26/2013   Cyst of ovary 12/26/2013   Female genital symptoms 12/26/2013    Past Surgical History:  Procedure Laterality Date   ABLATION  2011   abdominal for heavy periods     OB History   No obstetric history on file.      Home Medications    Prior to Admission medications   Medication Sig Start Date End Date Taking?  Authorizing Provider  progesterone (PROMETRIUM) 200 MG capsule Take 1 capsule by mouth at bedtime. 02/20/23  Yes [provider]  acyclovir (ZOVIRAX) 400 MG tablet Take 1 tablet by mouth 3 (three) times daily. Reported on 10/01/2015 01/05/15   [provider]  albuterol (PROAIR HFA) 108 (90 BASE) MCG/ACT inhaler Inhale into the lungs. Reported on 10/01/2015    [provider]  cyanocobalamin (,VITAMIN B-12,) 1000 MCG/ML injection Inject into the muscle. Reported on 10/01/2015 11/28/14   [provider]  estradiol (VIVELLE-DOT) 0.05 MG/24HR patch 1 patch 2 (two) times a week.    [provider]  fexofenadine (ALLEGRA ODT) 30 MG disintegrating tablet Take 30 mg by mouth. Reported on 10/01/2015    [provider]  montelukast (SINGULAIR) 10 MG tablet Take 10 mg by mouth at bedtime.    [provider]  SYRINGE-NEEDLE, DISP, 3 ML (B-D 3CC LUER-LOK SYR 25GX1") 25G X 1" 3 ML MISC Reported on 10/01/2015 06/09/14   [provider]    Family History Family History  Problem Relation Age of Onset   Hypertension Father    Cancer Father    Breast cancer Maternal Aunt 88   Breast cancer Maternal Grandmother 31    Social History Social History   Tobacco Use   Smoking status: Never   Smokeless tobacco: Never  Substance Use Topics   Alcohol use: No  Drug use: No     Allergies   Iron and Sulfa antibiotics   Review of Systems Review of Systems  Constitutional:  Negative for chills and fever.  HENT:  Positive for ear pain. Negative for ear discharge and sore throat.   Respiratory:  Negative for cough and shortness of breath.   Cardiovascular:  Negative for chest pain and palpitations.  Gastrointestinal:  Negative for abdominal pain, nausea and vomiting.  Genitourinary:  Positive for dysuria. Negative for flank pain, frequency, hematuria, pelvic pain and vaginal discharge.  Neurological:  Positive for headaches. Negative for  dizziness, syncope, facial asymmetry, speech difficulty, weakness and numbness.     Physical Exam Triage Vital Signs ED Triage Vitals  Encounter Vitals Group     BP 05/20/23 1051 122/80     Systolic BP Percentile --      Diastolic BP Percentile --      Pulse Rate 05/20/23 1051 88     Resp 05/20/23 1051 18     Temp 05/20/23 1051 98.2 F (36.8 C)     Temp src --      SpO2 05/20/23 1051 98 %     Weight --      Height --      Head Circumference --      Peak Flow --      Pain Score 05/20/23 1055 5     Pain Loc --      Pain Education --      Exclude from Growth Chart --    No data found.  Updated Vital Signs BP 122/80   Pulse 88   Temp 98.2 F (36.8 C)   Resp 18   SpO2 98%   Visual Acuity Right Eye Distance:   Left Eye Distance:   Bilateral Distance:    Right Eye Near:   Left Eye Near:    Bilateral Near:     Physical Exam Vitals and nursing note reviewed.  Constitutional:      General: She is not in acute distress.    Appearance: She is well-developed.  HENT:     Head: Normocephalic and atraumatic.     Right Ear: Tympanic membrane normal.     Left Ear: Tympanic membrane normal.     Nose: Nose normal.     Mouth/Throat:     Mouth: Mucous membranes are moist.     Pharynx: Oropharynx is clear.  Eyes:     Pupils: Pupils are equal, round, and reactive to light.  Cardiovascular:     Rate and Rhythm: Normal rate and regular rhythm.     Heart sounds: Normal heart sounds.  Pulmonary:     Effort: Pulmonary effort is normal. No respiratory distress.     Breath sounds: Normal breath sounds.  Abdominal:     General: Bowel sounds are normal.     Palpations: Abdomen is soft.     Tenderness: There is no abdominal tenderness. There is no right CVA tenderness, guarding or rebound.  Musculoskeletal:     Cervical back: Neck supple.  Skin:    General: Skin is warm and dry.  Neurological:     General: No focal deficit present.     Mental Status: She is alert and  oriented to person, place, and time.     Cranial Nerves: No cranial nerve deficit.     Sensory: No sensory deficit.     Motor: No weakness.     Gait: Gait normal.  Psychiatric:  Mood and Affect: Mood normal.        Behavior: Behavior normal.      UC Treatments / Results  Labs (all labs ordered are listed, but only abnormal results are displayed) Labs Reviewed  POCT URINALYSIS DIP (MANUAL ENTRY) - Abnormal; Notable for the following components:      Result Value   Blood, UA moderate (*)    All other components within normal limits    EKG   Radiology No results found.  Procedures Procedures (including critical care time)  Medications Ordered in UC Medications  ketorolac (TORADOL) 30 MG/ML injection 30 mg (30 mg Intramuscular Given 05/20/23 1119)    Initial Impression / Assessment and Plan / UC Course  I have reviewed the triage vital signs and the nursing notes.  Pertinent labs & imaging results that were available during my care of the patient were reviewed by me and considered in my medical decision making (see chart for details).    Acute headache, right ear pain, dysuria, microscopic hematuria.  Patient is alert, active, in no acute distress.  Afebrile and vital signs are stable.  Toradol given here for headache.  Cautioned patient not to use OTC NSAIDs today.  Urine does not indicate infection but does have moderate blood.  Education provided on headache, earache, dysuria, hematuria.  Instructed patient to follow-up with her PCP on Monday.  ED precautions given.  She agrees to plan of care.  Final Clinical Impressions(s) / UC Diagnoses   Final diagnoses:  Acute nonintractable headache, unspecified headache type  Right ear pain  Dysuria  Microscopic hematuria     Discharge Instructions      You were given an injection of Toradol today for your headache.  Follow-up with your primary care provider on Monday.  Go to the emergency department if you have  persistent or worsening symptoms.    See the attached information on headache, earache, dysuria, hematuria.     ED Prescriptions   None    PDMP not reviewed this encounter.   Mickie Bail, NP 05/20/23 1125

## 2023-09-29 ENCOUNTER — Other Ambulatory Visit: Payer: Self-pay | Admitting: Internal Medicine

## 2023-09-29 DIAGNOSIS — Z1231 Encounter for screening mammogram for malignant neoplasm of breast: Secondary | ICD-10-CM

## 2023-10-20 ENCOUNTER — Encounter (INDEPENDENT_AMBULATORY_CARE_PROVIDER_SITE_OTHER): Payer: Self-pay | Admitting: Nurse Practitioner

## 2023-10-31 ENCOUNTER — Encounter (INDEPENDENT_AMBULATORY_CARE_PROVIDER_SITE_OTHER): Payer: Self-pay | Admitting: Nurse Practitioner

## 2023-11-16 ENCOUNTER — Encounter (INDEPENDENT_AMBULATORY_CARE_PROVIDER_SITE_OTHER): Payer: Self-pay | Admitting: Nurse Practitioner

## 2023-11-16 ENCOUNTER — Ambulatory Visit (INDEPENDENT_AMBULATORY_CARE_PROVIDER_SITE_OTHER): Payer: 59 | Admitting: Nurse Practitioner

## 2023-11-16 VITALS — BP 134/81 | HR 89 | Resp 18 | Ht 70.0 in | Wt 213.4 lb

## 2023-11-16 DIAGNOSIS — M7989 Other specified soft tissue disorders: Secondary | ICD-10-CM

## 2023-11-20 NOTE — Progress Notes (Signed)
 Subjective:    Patient ID: Lauren Adkins, female    DOB: [birth year], 53 y.o.   MRN: [id number] Chief Complaint  Patient presents with   New Patient (Initial Visit)    NP. consult. LLE edema. phillips    The patient is a 53 year old female who presents today for evaluation of left lower extremity edema.  She notes that this has been ongoing for the last 21 years since her pregnancy.  She notes that her her leg becomes tight and painful and it becomes very difficult for her to ambulate.  This pain and swelling that she has has become lifestyle limiting and debilitating for her.  She has worn medical grade compression and that has not solved her symptoms.  He does improve it somewhat however.  She has previously been treated for her venous issues including 3 ablations with the last was in 2022.  Unfortunately those records are unavailable to me today.  She denies classic claudication-like symptoms.  There are no open wounds or ulcerations.    Review of Systems  Cardiovascular:  Positive for leg swelling.  All other systems reviewed and are negative.      Objective:   Physical Exam Vitals reviewed.  HENT:     Head: Normocephalic.  Cardiovascular:     Rate and Rhythm: Normal rate.     Pulses:          Dorsalis pedis pulses are 2+ on the left side.       Posterior tibial pulses are 1+ on the left side.  Pulmonary:     Effort: Pulmonary effort is normal.  Skin:    General: Skin is warm and dry.  Neurological:     Mental Status: She is alert and oriented to person, place, and time.  Psychiatric:        Mood and Affect: Mood normal.        Behavior: Behavior normal.        Thought Content: Thought content normal.        Judgment: Judgment normal.     BP 134/81   Pulse 89   Resp 18   Ht 5' 10 (1.778 m)   Wt 213 lb 6.4 oz (96.8 kg)   BMI 30.62 kg/m   Past Medical History:  Diagnosis Date   Absolute anemia 12/26/2013   Allergy    Anemia, iron deficiency  12/26/2013   Cyst of ovary 12/26/2013   Female genital symptoms 12/26/2013   Microhematuria 06/01/2015    Social History   Socioeconomic History   Marital status: Married    Spouse name: Not on file   Number of children: Not on file   Years of education: Not on file   Highest education level: Not on file  Occupational History   Not on file  Tobacco Use   Smoking status: Never   Smokeless tobacco: Never  Substance and Sexual Activity   Alcohol use: No   Drug use: No   Sexual activity: Yes    Birth control/protection: Other-see comments    Comment: husband w/ vastectomy  Other Topics Concern   Not on file  Social History Narrative   Not on file   Social Drivers of Health   Financial Resource Strain: Low Risk  (03/09/2023)   Received from [facility], [facility]   Overall Financial Resource Strain (CARDIA)    Difficulty of Paying Living Expenses: Not hard at all  Food Insecurity: No Food Insecurity (03/09/2023)  Received from [facility], [facility]   Hunger Vital Sign    Worried About Running Out of Food in the Last Year: Never true    Ran Out of Food in the Last Year: Never true  Transportation Needs: No Transportation Needs (03/09/2023)   Received from [facility], [facility]   [facility] - Transportation    In the past 12 months, has lack of transportation kept you from medical appointments or from getting medications?: No    Lack of Transportation (Non-Medical): No  Physical Activity: Not on file  Stress: Not on file  Social Connections: Not on file  Intimate Partner Violence: Not on file    Past Surgical History:  Procedure Laterality Date   ABLATION  2011   abdominal for heavy periods     Family History  Problem Relation Age of Onset   Hypertension Father    Cancer Father    Breast cancer Maternal Aunt 80   Breast cancer Maternal Grandmother 31     Allergies  Allergen Reactions   Iron Nausea Only    Oral Fe - extreme nausea   Sulfa Antibiotics Other (See Comments) and Rash    Swelling of throat       Latest Ref Rng & Units 08/15/2014    4:32 PM 11/28/2013    2:56 PM 10/01/2012    3:57 PM  CBC  WBC 3.6 - 11.0 x10 3/mm 11.1  9.4  9.0   Hemoglobin 12.0 - 16.0 g/dL 85.5  85.6  85.5   Hematocrit 35.0 - 47.0 % 42.7  43.6  42.6   Platelets 150 - 440 x10 3/mm 215  243  218       CMP  No results found for: NA, K, CL, CO2, GLUCOSE, BUN, CREATININE, CALCIUM, PROT, ALBUMIN, AST, ALT, ALKPHOS, BILITOT, GFR, EGFR, GFRNONAA   No results found.     Assessment & Plan:   1. Swelling of limb (Primary) I had a discussion with the patient in regards to lower extremity edema.  Will have her return for venous reflux studies to determine if there has been possible recannulization of her venous treatments.  She is also advised to continue with conservative therapies including use of medical grade compression, elevation and activity as tolerable.   Current Outpatient Medications on File Prior to Visit  Medication Sig Dispense Refill   acyclovir (ZOVIRAX) 400 MG tablet Take 1 tablet by mouth 3 (three) times daily. Reported on 10/01/2015     albuterol (PROAIR HFA) 108 (90 BASE) MCG/ACT inhaler Inhale into the lungs. Reported on 10/01/2015     cyanocobalamin (,VITAMIN B-12,) 1000 MCG/ML injection Inject into the muscle. Reported on 10/01/2015     estradiol (VIVELLE-DOT) 0.05 MG/24HR patch 1 patch 2 (two) times a week.     montelukast (SINGULAIR) 10 MG tablet Take 10 mg by mouth at bedtime.     progesterone (PROMETRIUM) 200 MG capsule Take 1 capsule by mouth at bedtime.     SYRINGE-NEEDLE, DISP, 3 ML (B-D 3CC LUER-LOK SYR 25GX1) 25G X 1 3 ML MISC Reported on 10/01/2015     fexofenadine (ALLEGRA ODT) 30 MG disintegrating tablet Take 30 mg by mouth. Reported on 10/01/2015     No current  facility-administered medications on file prior to visit.    There are no Patient Instructions on file for this visit. No follow-ups on file.   Luqman Perrelli E Dhanush Jokerst, NP

## 2023-12-20 ENCOUNTER — Ambulatory Visit (INDEPENDENT_AMBULATORY_CARE_PROVIDER_SITE_OTHER): Payer: 59 | Admitting: Nurse Practitioner

## 2023-12-20 ENCOUNTER — Encounter (INDEPENDENT_AMBULATORY_CARE_PROVIDER_SITE_OTHER): Payer: 59

## 2024-01-19 ENCOUNTER — Ambulatory Visit
Admission: RE | Admit: 2024-01-19 | Discharge: 2024-01-19 | Disposition: A | Discharge status: Home / Self Care | Payer: Self-pay | Source: Ambulatory Visit | Attending: Internal Medicine | Admitting: Internal Medicine

## 2024-01-19 DIAGNOSIS — Z1231 Encounter for screening mammogram for malignant neoplasm of breast: Secondary | ICD-10-CM | POA: Insufficient documentation

## 2024-02-27 ENCOUNTER — Encounter (INDEPENDENT_AMBULATORY_CARE_PROVIDER_SITE_OTHER): Payer: Self-pay

## 2024-03-18 ENCOUNTER — Emergency Department (HOSPITAL_COMMUNITY)
Admission: EM | Admit: 2024-03-18 | Discharge: 2024-03-19 | Disposition: A | Discharge status: Home / Self Care | Attending: Emergency Medicine | Admitting: Emergency Medicine

## 2024-03-18 ENCOUNTER — Encounter (HOSPITAL_COMMUNITY): Payer: Self-pay

## 2024-03-18 DIAGNOSIS — N289 Disorder of kidney and ureter, unspecified: Secondary | ICD-10-CM | POA: Diagnosis not present

## 2024-03-18 DIAGNOSIS — R569 Unspecified convulsions: Secondary | ICD-10-CM | POA: Diagnosis present

## 2024-03-18 DIAGNOSIS — D72829 Elevated white blood cell count, unspecified: Secondary | ICD-10-CM | POA: Insufficient documentation

## 2024-03-18 DIAGNOSIS — N390 Urinary tract infection, site not specified: Secondary | ICD-10-CM | POA: Insufficient documentation

## 2024-03-18 LAB — HCG, SERUM, QUALITATIVE: Preg, Serum: NEGATIVE

## 2024-03-18 LAB — CBC
HCT: 42.8 % (ref 36.0–46.0)
Hemoglobin: 13.8 g/dL (ref 12.0–15.0)
MCH: 29 pg (ref 26.0–34.0)
MCHC: 32.2 g/dL (ref 30.0–36.0)
MCV: 89.9 fL (ref 80.0–100.0)
Platelets: 333 10*3/uL (ref 150–400)
RBC: 4.76 MIL/uL (ref 3.87–5.11)
RDW: 14.3 % (ref 11.5–15.5)
WBC: 18.5 10*3/uL — ABNORMAL HIGH (ref 4.0–10.5)
nRBC: 0 % (ref 0.0–0.2)

## 2024-03-18 LAB — COMPREHENSIVE METABOLIC PANEL WITH GFR
ALT: 11 U/L (ref 0–44)
AST: 14 U/L — ABNORMAL LOW (ref 15–41)
Albumin: 3.6 g/dL (ref 3.5–5.0)
Alkaline Phosphatase: 106 U/L (ref 38–126)
Anion gap: 11 (ref 5–15)
BUN: 15 mg/dL (ref 6–20)
CO2: 23 mmol/L (ref 22–32)
Calcium: 8.8 mg/dL — ABNORMAL LOW (ref 8.9–10.3)
Chloride: 104 mmol/L (ref 98–111)
Creatinine, Ser: 1.01 mg/dL — ABNORMAL HIGH (ref 0.44–1.00)
GFR, Estimated: >60 mL/min (ref >60)
Glucose, Bld: 121 mg/dL — ABNORMAL HIGH (ref 70–99)
Potassium: 3.3 mmol/L — ABNORMAL LOW (ref 3.5–5.1)
Sodium: 138 mmol/L (ref 135–145)
Total Bilirubin: 0.5 mg/dL (ref 0.0–1.2)
Total Protein: 7.8 g/dL (ref 6.5–8.1)

## 2024-03-18 LAB — CBG MONITORING, ED: Glucose-Capillary: 120 mg/dL — ABNORMAL HIGH (ref 70–99)

## 2024-03-18 NOTE — ED Triage Notes (Signed)
 Pt states that she has been having UTI symptoms since yesterday, dysuria and incontinence. Today reports that she had a syncopal episode and her family reports she may have had some "seizure like activity' that her legs were straightened out. No hx of seizures. EMS came to check out pt, c/o of generalized pain all over.

## 2024-03-19 ENCOUNTER — Emergency Department (HOSPITAL_COMMUNITY)

## 2024-03-19 LAB — URINALYSIS, ROUTINE W REFLEX MICROSCOPIC
Bilirubin Urine: NEGATIVE
Glucose, UA: NEGATIVE mg/dL
Ketones, ur: NEGATIVE mg/dL
Nitrite: NEGATIVE
Protein, ur: NEGATIVE mg/dL
Specific Gravity, Urine: 1.033 — ABNORMAL HIGH (ref 1.005–1.030)
pH: 5 (ref 5.0–8.0)

## 2024-03-19 MED ORDER — SODIUM CHLORIDE 0.9 % IV BOLUS
1000.0000 mL | Freq: Once | INTRAVENOUS | Status: AC
  Administered 2024-03-19: 1000 mL via INTRAVENOUS

## 2024-03-19 MED ORDER — NITROFURANTOIN MONOHYD MACRO 100 MG PO CAPS: 100.0000 mg | ORAL_CAPSULE | Freq: Two times a day (BID) | ORAL | 0 refills | Status: AC

## 2024-03-19 NOTE — ED Provider Notes (Signed)
 [place] EMERGENCY DEPARTMENT AT [facility] Provider Note  CSN: [id number] Arrival date & time: 03/18/24 2056  Chief Complaint(s) Loss of Consciousness and Urinary Tract Infection  HPI Lauren Adkins is a 53 y.o. female    The history is provided by the patient.  Seizures Seizure activity on arrival: no   Seizure type:  Tonic Episode characteristics: eye deviation, stiffening and unresponsiveness   Postictal symptoms: confusion   Return to baseline: yes   Duration:  15 seconds Timing:  Once Context: not alcohol withdrawal, not drug use, not fever, not possible hypoglycemia, not possible medication ingestion and not pregnant   History of seizures: no    Patient reports that she has been having back pain and was started on prednisone recently.  She also reports that she has been having frequency and difficulty emptying her bladder.  Believes she has a urinary tract infection.  Past Medical History Past Medical History:  Diagnosis Date   Absolute anemia 12/26/2013   Allergy    Anemia, iron deficiency 12/26/2013   Cyst of ovary 12/26/2013   Female genital symptoms 12/26/2013   Microhematuria 06/01/2015   Patient Active Problem List   Diagnosis Date Noted   Microhematuria 06/01/2015   Pelvic pain in female 06/01/2015   Absolute anemia 12/26/2013   Soft tissue lesion of shoulder region 12/26/2013   Excessive and frequent menstruation 12/26/2013   Anemia, iron deficiency 12/26/2013   Cyst of ovary 12/26/2013   Female genital symptoms 12/26/2013   Home Medication(s) Prior to Admission medications   Medication Sig Start Date End Date Taking? Authorizing Provider  nitrofurantoin, macrocrystal-monohydrate, (MACROBID) 100 MG capsule Take 1 capsule (100 mg total) by mouth 2 (two) times daily. 03/19/24  Yes Larrie Lucia, Camila Cecil, MD  acyclovir (ZOVIRAX) 400 MG tablet Take 1 tablet by mouth 3 (three) times daily. Reported on 10/01/2015 01/05/15   [provider]  albuterol (PROAIR HFA) 108 (90 BASE) MCG/ACT inhaler Inhale into the lungs. Reported on 10/01/2015    [provider]  cyanocobalamin (,VITAMIN B-12,) 1000 MCG/ML injection Inject into the muscle. Reported on 10/01/2015 11/28/14   [provider]  estradiol (VIVELLE-DOT) 0.05 MG/24HR patch 1 patch 2 (two) times a week.    [provider]  fexofenadine (ALLEGRA ODT) 30 MG disintegrating tablet Take 30 mg by mouth. Reported on 10/01/2015    [provider]  montelukast (SINGULAIR) 10 MG tablet Take 10 mg by mouth at bedtime.    [provider]  progesterone (PROMETRIUM) 200 MG capsule Take 1 capsule by mouth at bedtime. 02/20/23   [provider]  SYRINGE-NEEDLE, DISP, 3 ML (B-D 3CC LUER-LOK SYR 25GX1") 25G X 1" 3 ML MISC Reported on 10/01/2015 06/09/14   [provider]  Allergies Iron and Sulfa antibiotics  Review of Systems Review of Systems  Neurological:  Positive for seizures.   As noted in HPI  Physical Exam Vital Signs  I have reviewed the triage vital signs BP 129/74 (BP Location: Right Arm)   Pulse 81   Temp 98.1 F (36.7 C) (Oral)   Resp 18   Ht 5\' 10"  (1.778 m)   Wt 95.3 kg   SpO2 96%   BMI 30.13 kg/m   Physical Exam Vitals reviewed.  Constitutional:      General: She is not in acute distress.    Appearance: She is well-developed. She is not diaphoretic.  HENT:     Head: Normocephalic and atraumatic.     Nose: Nose normal.  Eyes:     General: No scleral icterus.       Right eye: No discharge.        Left eye: No discharge.     Conjunctiva/sclera: Conjunctivae normal.     Pupils: Pupils are equal, round, and reactive to light.  Cardiovascular:     Rate and Rhythm: Normal rate and regular rhythm.     Heart sounds: No murmur heard.    No friction rub. No  gallop.  Pulmonary:     Effort: Pulmonary effort is normal. No respiratory distress.     Breath sounds: Normal breath sounds. No stridor. No rales.  Abdominal:     General: There is no distension.     Palpations: Abdomen is soft.     Tenderness: There is no abdominal tenderness.  Musculoskeletal:        General: No tenderness.     Cervical back: Normal range of motion and neck supple.  Skin:    General: Skin is warm and dry.     Findings: No erythema or rash.  Neurological:     Mental Status: She is alert and oriented to person, place, and time.     Comments: Mental Status:  Alert and oriented to person, place, and time.  Attention and concentration normal.  Speech clear.  Recent memory is intact  Cranial Nerves:  II Visual Fields: Intact to confrontation. Visual fields intact. III, IV, VI: Pupils equal and reactive to light and near. Full eye movement without nystagmus  V Facial Sensation: Normal. No weakness of masticatory muscles  VII: No facial weakness or asymmetry  VIII Auditory Acuity: Grossly normal  IX/X: The uvula is midline; the palate elevates symmetrically  XI: Normal sternocleidomastoid and trapezius strength  XII: The tongue is midline. No atrophy or fasciculations.   Motor System: Muscle Strength: 5/5 and symmetric in the upper and lower extremities. No pronation or drift.  Muscle Tone: Tone and muscle bulk are normal in the upper and lower extremities.  Coordination: Intact finger-to-nose.No tremor.  Sensation: Intact to light touch. Gait: Routine  gait normal.      ED Results and Treatments Labs (all labs ordered are listed, but only abnormal results are displayed) Labs Reviewed  COMPREHENSIVE METABOLIC PANEL WITH GFR - Abnormal; Notable for the following components:      Result Value   Potassium 3.3 (*)    Glucose, Bld 121 (*)    Creatinine, Ser 1.01 (*)    Calcium 8.8 (*)    AST 14 (*)    All other components within normal limits  CBC -  Abnormal; Notable for the following components:   WBC 18.5 (*)    All other components within normal limits  URINALYSIS, ROUTINE W REFLEX  MICROSCOPIC - Abnormal; Notable for the following components:   Specific Gravity, Urine 1.033 (*)    Hgb urine dipstick SMALL (*)    Leukocytes,Ua SMALL (*)    Bacteria, UA RARE (*)    All other components within normal limits  CBG MONITORING, ED - Abnormal; Notable for the following components:   Glucose-Capillary 120 (*)    All other components within normal limits  HCG, SERUM, QUALITATIVE                                                                                                                         EKG  EKG Interpretation Date/Time:  Monday March 18 2024 21:20:20 EDT Ventricular Rate:  88 PR Interval:  146 QRS Duration:  94 QT Interval:  369 QTC Calculation: 447 R Axis:   46  Text Interpretation: Sinus rhythm ST elev, probable normal early repol pattern No old tracing to compare Confirmed by Townsend Freud [phone]) on 03/18/2024 11:40:20 PM       Radiology CT Head Wo Contrast Result Date: 03/19/2024 CLINICAL DATA:  Seizure, new-onset, no history of trauma.  Syncope. EXAM: CT HEAD WITHOUT CONTRAST TECHNIQUE: Contiguous axial images were obtained from the base of the skull through the vertex without intravenous contrast. RADIATION DOSE REDUCTION: This exam was performed according to the departmental dose-optimization program which includes automated exposure control, adjustment of the mA and/or kV according to patient size and/or use of iterative reconstruction technique. COMPARISON:  MRI 05/20/2011 FINDINGS: Brain: No acute intracranial abnormality. Specifically, no hemorrhage, hydrocephalus, mass lesion, acute infarction, or significant intracranial injury. Vascular: No hyperdense vessel or unexpected calcification. Skull: No acute calvarial abnormality. Sinuses/Orbits: No acute findings Other: None IMPRESSION: Normal study. Electronically  Signed   By: Janeece Mechanic M.D.   On: 03/19/2024 01:54    Medications Ordered in ED Medications  sodium chloride 0.9 % bolus 1,000 mL (0 mLs Intravenous Stopped 03/19/24 0429)   Procedures Procedures  (including critical care time) Medical Decision Making / ED Course   Medical Decision Making Amount and/or Complexity of Data Reviewed Labs: ordered. Decision-making details documented in ED Course. Radiology: ordered and independent interpretation performed. Decision-making details documented in ED Course. ECG/medicine tests: ordered and independent interpretation performed. Decision-making details documented in ED Course.  Risk Prescription drug management. Decision regarding hospitalization.    Episode of loss of consciousness does favor first-time seizure given the postictal state. No obvious triggering factors.  No fevers concerning for meningitis/encephalitis. No focal deficits concerning for CVA or ICH. Unlikely related to steroid recently started on.  CT head obtained and negative for ICH or mass effect. Labs without significant electrolyte derangements.  Mild renal insufficiency without AKI. CBC does have leukocytosis which is likely due to demargination from seizure as well as steroid use. UPT was negative ruling out pregnancy related seizure. CBG was normal UA not overtly concerning for UTI but is questionable.  Certainly not clear. EKG without acute ischemic changes, dysrhythmias or blocks  The patient  was monitored for several hours provided with IV fluids.  She was able to ambulate without significant complication.  Will treat for possible UTI.  Seizure precautions recommended.  Neurology follow-up.    Final Clinical Impression(s) / ED Diagnoses Final diagnoses:  First time seizure (HCC)  Acute lower UTI   The patient appears reasonably screened and/or stabilized for discharge and I doubt any other medical condition or other [facility] requiring further screening,  evaluation, or treatment in the ED at this time. I have discussed the findings, Dx and Tx plan with the patient/family who expressed understanding and agree(s) with the plan. Discharge instructions discussed at length. The patient/family was given strict return precautions who verbalized understanding of the instructions. No further questions at time of discharge.  Disposition: Discharge  Condition: Good  ED Discharge Orders          Ordered    nitrofurantoin, macrocrystal-monohydrate, (MACROBID) 100 MG capsule  2 times daily        03/19/24 0428             Follow Up: [facility] [address]     [address] [place]  [id number] [phone] Call  to schedule an appointment for close follow up  Jimmy Moulding, MD [address] [facility] [address] [place] [address] [phone]  Call       This chart was dictated using voice recognition software.  Despite best efforts to proofread,  errors can occur which can change the documentation meaning.    Lindle Rhea, MD 03/19/24 [phone]
